# Patient Record
Sex: Female | Born: 1965 | Race: Black or African American | Hispanic: No | Smoking: Former smoker
Health system: Southern US, Community
[De-identification: ages and names within clinical notes are randomized; demographics above are authoritative.]

## PROBLEM LIST (undated history)

## (undated) DIAGNOSIS — F1910 Other psychoactive substance abuse, uncomplicated: Secondary | ICD-10-CM

## (undated) DIAGNOSIS — I10 Essential (primary) hypertension: Secondary | ICD-10-CM

## (undated) DIAGNOSIS — Z87898 Personal history of other specified conditions: Secondary | ICD-10-CM

## (undated) DIAGNOSIS — E119 Type 2 diabetes mellitus without complications: Secondary | ICD-10-CM

## (undated) DIAGNOSIS — G709 Myoneural disorder, unspecified: Secondary | ICD-10-CM

## (undated) DIAGNOSIS — N94810 Vulvar vestibulitis: Secondary | ICD-10-CM

## (undated) HISTORY — PX: INDUCED ABORTION: SHX677

## (undated) HISTORY — DX: Type 2 diabetes mellitus without complications: E11.9

## (undated) HISTORY — DX: Personal history of other specified conditions: Z87.898

## (undated) HISTORY — DX: Other psychoactive substance abuse, uncomplicated: F19.10

## (undated) HISTORY — PX: BUNIONECTOMY: SHX129

## (undated) HISTORY — DX: Vulvar vestibulitis: N94.810

---

## 1998-02-20 ENCOUNTER — Inpatient Hospital Stay (HOSPITAL_COMMUNITY): Admission: AD | Admit: 1998-02-20 | Discharge: 1998-02-23 | Payer: Self-pay | Admitting: Obstetrics

## 1998-09-24 HISTORY — PX: TUBAL LIGATION: SHX77

## 1999-12-28 ENCOUNTER — Other Ambulatory Visit: Admission: RE | Admit: 1999-12-28 | Discharge: 1999-12-28 | Payer: Self-pay | Admitting: Obstetrics

## 2000-01-04 ENCOUNTER — Ambulatory Visit (HOSPITAL_COMMUNITY): Admission: RE | Admit: 2000-01-04 | Discharge: 2000-01-04 | Payer: Self-pay | Admitting: Obstetrics

## 2001-08-14 DIAGNOSIS — Z87898 Personal history of other specified conditions: Secondary | ICD-10-CM

## 2001-08-14 HISTORY — DX: Personal history of other specified conditions: Z87.898

## 2003-03-19 ENCOUNTER — Other Ambulatory Visit: Admission: RE | Admit: 2003-03-19 | Discharge: 2003-03-19 | Payer: Self-pay | Admitting: Obstetrics and Gynecology

## 2003-09-25 DIAGNOSIS — N94810 Vulvar vestibulitis: Secondary | ICD-10-CM

## 2003-09-25 HISTORY — DX: Vulvar vestibulitis: N94.810

## 2004-07-28 ENCOUNTER — Other Ambulatory Visit: Admission: RE | Admit: 2004-07-28 | Discharge: 2004-07-28 | Payer: Self-pay | Admitting: Obstetrics and Gynecology

## 2004-11-09 ENCOUNTER — Emergency Department (HOSPITAL_COMMUNITY): Admission: EM | Admit: 2004-11-09 | Discharge: 2004-11-09 | Payer: Self-pay | Admitting: Emergency Medicine

## 2006-12-04 ENCOUNTER — Emergency Department (HOSPITAL_COMMUNITY): Admission: EM | Admit: 2006-12-04 | Discharge: 2006-12-04 | Payer: Self-pay | Admitting: Family Medicine

## 2007-01-07 ENCOUNTER — Emergency Department (HOSPITAL_COMMUNITY): Admission: EM | Admit: 2007-01-07 | Discharge: 2007-01-07 | Payer: Self-pay | Admitting: Emergency Medicine

## 2007-08-19 ENCOUNTER — Other Ambulatory Visit: Admission: RE | Admit: 2007-08-19 | Discharge: 2007-08-19 | Payer: Self-pay | Admitting: Obstetrics and Gynecology

## 2008-05-03 ENCOUNTER — Emergency Department (HOSPITAL_COMMUNITY): Admission: EM | Admit: 2008-05-03 | Discharge: 2008-05-03 | Payer: Self-pay | Admitting: Emergency Medicine

## 2008-05-05 ENCOUNTER — Ambulatory Visit: Payer: Self-pay | Admitting: *Deleted

## 2008-07-03 ENCOUNTER — Emergency Department (HOSPITAL_COMMUNITY): Admission: EM | Admit: 2008-07-03 | Discharge: 2008-07-03 | Payer: Self-pay | Admitting: Emergency Medicine

## 2008-10-19 ENCOUNTER — Other Ambulatory Visit: Admission: RE | Admit: 2008-10-19 | Discharge: 2008-10-19 | Payer: Self-pay | Admitting: Obstetrics and Gynecology

## 2009-10-31 ENCOUNTER — Encounter: Admission: RE | Admit: 2009-10-31 | Discharge: 2009-10-31 | Payer: Self-pay | Admitting: Obstetrics and Gynecology

## 2009-11-22 HISTORY — PX: CHOLECYSTECTOMY, LAPAROSCOPIC: SHX56

## 2009-12-22 ENCOUNTER — Encounter: Admission: RE | Admit: 2009-12-22 | Discharge: 2009-12-22 | Payer: Self-pay | Admitting: Surgery

## 2009-12-28 ENCOUNTER — Encounter (INDEPENDENT_AMBULATORY_CARE_PROVIDER_SITE_OTHER): Payer: Self-pay

## 2009-12-28 ENCOUNTER — Inpatient Hospital Stay (HOSPITAL_COMMUNITY): Admission: EM | Admit: 2009-12-28 | Discharge: 2009-12-29 | Payer: Self-pay | Admitting: Emergency Medicine

## 2010-01-05 ENCOUNTER — Encounter: Admission: RE | Admit: 2010-01-05 | Discharge: 2010-01-05 | Payer: Self-pay | Admitting: Surgery

## 2010-10-14 ENCOUNTER — Other Ambulatory Visit: Payer: Self-pay | Admitting: Family Medicine

## 2010-10-14 DIAGNOSIS — Z1231 Encounter for screening mammogram for malignant neoplasm of breast: Secondary | ICD-10-CM

## 2010-10-14 DIAGNOSIS — Z1239 Encounter for other screening for malignant neoplasm of breast: Secondary | ICD-10-CM

## 2010-10-15 ENCOUNTER — Encounter: Payer: Self-pay | Admitting: Surgery

## 2010-11-01 ENCOUNTER — Ambulatory Visit
Admission: RE | Admit: 2010-11-01 | Discharge: 2010-11-01 | Disposition: A | Discharge status: Home / Self Care | Payer: 59 | Source: Ambulatory Visit | Attending: Family Medicine | Admitting: Family Medicine

## 2010-11-01 DIAGNOSIS — Z1231 Encounter for screening mammogram for malignant neoplasm of breast: Secondary | ICD-10-CM

## 2010-12-13 LAB — CBC
MCV: 93.9 fL (ref 78.0–100.0)
Platelets: 301 10*3/uL (ref 150–400)
RBC: 4.35 MIL/uL (ref 3.87–5.11)
RDW: 13.3 % (ref 11.5–15.5)
WBC: 9.7 10*3/uL (ref 4.0–10.5)

## 2010-12-13 LAB — COMPREHENSIVE METABOLIC PANEL
AST: 16 U/L (ref 0–37)
CO2: 30 mEq/L (ref 19–32)
Chloride: 102 mEq/L (ref 96–112)
Creatinine, Ser: 0.71 mg/dL (ref 0.4–1.2)
Glucose, Bld: 105 mg/dL — ABNORMAL HIGH (ref 70–99)
Potassium: 3.3 mEq/L — ABNORMAL LOW (ref 3.5–5.1)
Total Protein: 7.4 g/dL (ref 6.0–8.3)

## 2010-12-13 LAB — URINALYSIS, ROUTINE W REFLEX MICROSCOPIC
Ketones, ur: NEGATIVE mg/dL
Protein, ur: NEGATIVE mg/dL
Urobilinogen, UA: 1 mg/dL (ref 0.0–1.0)

## 2010-12-13 LAB — DIFFERENTIAL
Basophils Absolute: 0.1 10*3/uL (ref 0.0–0.1)
Basophils Relative: 1 % (ref 0–1)
Monocytes Relative: 5 % (ref 3–12)

## 2011-02-09 NOTE — Op Note (Signed)
[facility]  Patient:    Kayla Rojas, Kayla Rojas            MRN: [id number] Proc. Date: 01/04/00 Adm. Date:  [id number] Attending:  Venita Sheffield                           Operative Report  PREOPERATIVE DIAGNOSIS:       Multiparity.  POSTOPERATIVE DIAGNOSIS:  PROCEDURE:                    Open laparoscopic tubal sterilization.  SURGEON:                      Kathreen Cosier, M.D.  ANESTHESIA:                   General anesthesia.  DESCRIPTION OF PROCEDURE:     Under general anesthesia, patient in lithotomy position, abdomen, perineum and vagina were prepped and draped; bladder emptied  with a straight catheter.  Weighted speculum was placed in the vagina; anterior lip of the cervix grasped with a Hulka tenaculum.  In the umbilicus, a transverse incision was made and carried down to the fascia.  Fascia was cleaned and grasped with two Kochers.  Fascia and the peritoneum were opened with the Mayo scissors; the sleeve of the trocar inserted intraperitoneally and 3 L of CO2 infused intraperitoneally.  Visualizing scope inserted through the sleeve of the trocar. Uterus, tubes and ovaries were normal.  The cautery probe was inserted through he sleeve of the scope.  The right tube was grasped with the cautery probe and traced to the fimbria.  Starting one inch from the cornu, the tube was cauterized in a  total of five places, moving laterally from the first site of cautery; approximately two inches of tube were cauterized.  The procedure was done in a similar fashion on the other side.  The probe was removed and CO2 allowed to escape from the peritoneal cavity.  Fascia was closed with one suture of 0 Dexon and the skin closed with a subcuticular suture of 3-0 plain.  Patient tolerated the procedure well and taken to recovery room in good condition. DD:  01/04/00 TD:  01/04/00 Job: [id number]

## 2011-06-25 LAB — POCT URINALYSIS DIP (DEVICE)
Glucose, UA: NEGATIVE
Hgb urine dipstick: NEGATIVE
Operator id: 282151
Protein, ur: 100 — AB
Specific Gravity, Urine: 1.02
pH: 7

## 2011-06-25 LAB — GC/CHLAMYDIA PROBE AMP, GENITAL: Chlamydia, DNA Probe: NEGATIVE

## 2011-06-25 LAB — WET PREP, GENITAL
Trich, Wet Prep: NONE SEEN
WBC, Wet Prep HPF POC: NONE SEEN

## 2011-11-20 ENCOUNTER — Other Ambulatory Visit: Payer: Self-pay | Admitting: Family Medicine

## 2011-11-20 DIAGNOSIS — Z1231 Encounter for screening mammogram for malignant neoplasm of breast: Secondary | ICD-10-CM

## 2011-12-04 ENCOUNTER — Other Ambulatory Visit: Payer: Self-pay | Admitting: Gastroenterology

## 2011-12-04 ENCOUNTER — Ambulatory Visit
Admission: RE | Admit: 2011-12-04 | Discharge: 2011-12-04 | Disposition: A | Discharge status: Home / Self Care | Payer: 59 | Source: Ambulatory Visit | Attending: Family Medicine | Admitting: Family Medicine

## 2011-12-04 DIAGNOSIS — R1032 Left lower quadrant pain: Secondary | ICD-10-CM

## 2011-12-04 DIAGNOSIS — Z1231 Encounter for screening mammogram for malignant neoplasm of breast: Secondary | ICD-10-CM

## 2011-12-05 ENCOUNTER — Ambulatory Visit: Payer: 59

## 2011-12-21 ENCOUNTER — Ambulatory Visit
Admission: RE | Admit: 2011-12-21 | Discharge: 2011-12-21 | Disposition: A | Discharge status: Home / Self Care | Payer: 59 | Source: Ambulatory Visit | Attending: Gastroenterology | Admitting: Gastroenterology

## 2011-12-21 DIAGNOSIS — R1032 Left lower quadrant pain: Secondary | ICD-10-CM

## 2011-12-21 MED ORDER — IOHEXOL 300 MG/ML  SOLN
100.0000 mL | Freq: Once | INTRAMUSCULAR | Status: AC | PRN
  Administered 2011-12-21: 100 mL via INTRAVENOUS

## 2012-01-02 ENCOUNTER — Other Ambulatory Visit: Payer: Self-pay | Admitting: Gastroenterology

## 2012-01-02 DIAGNOSIS — R1032 Left lower quadrant pain: Secondary | ICD-10-CM

## 2012-01-02 DIAGNOSIS — M71052 Abscess of bursa, left hip: Secondary | ICD-10-CM

## 2012-01-02 DIAGNOSIS — R933 Abnormal findings on diagnostic imaging of other parts of digestive tract: Secondary | ICD-10-CM

## 2012-01-03 ENCOUNTER — Other Ambulatory Visit: Payer: Self-pay | Admitting: Gastroenterology

## 2012-01-03 DIAGNOSIS — R933 Abnormal findings on diagnostic imaging of other parts of digestive tract: Secondary | ICD-10-CM

## 2012-01-03 DIAGNOSIS — R1032 Left lower quadrant pain: Secondary | ICD-10-CM

## 2012-01-04 ENCOUNTER — Other Ambulatory Visit: Payer: Self-pay | Admitting: Gastroenterology

## 2012-01-04 ENCOUNTER — Other Ambulatory Visit: Payer: Self-pay | Admitting: Radiology

## 2012-01-10 ENCOUNTER — Other Ambulatory Visit: Payer: Self-pay | Admitting: Gastroenterology

## 2012-01-10 ENCOUNTER — Ambulatory Visit (HOSPITAL_COMMUNITY)
Admission: RE | Admit: 2012-01-10 | Discharge: 2012-01-10 | Disposition: A | Discharge status: Home / Self Care | Payer: 59 | Source: Ambulatory Visit | Attending: Gastroenterology | Admitting: Gastroenterology

## 2012-01-10 DIAGNOSIS — R1904 Left lower quadrant abdominal swelling, mass and lump: Secondary | ICD-10-CM | POA: Insufficient documentation

## 2012-01-10 DIAGNOSIS — R1032 Left lower quadrant pain: Secondary | ICD-10-CM

## 2012-01-10 DIAGNOSIS — I10 Essential (primary) hypertension: Secondary | ICD-10-CM | POA: Insufficient documentation

## 2012-01-10 DIAGNOSIS — R933 Abnormal findings on diagnostic imaging of other parts of digestive tract: Secondary | ICD-10-CM

## 2012-01-10 DIAGNOSIS — E119 Type 2 diabetes mellitus without complications: Secondary | ICD-10-CM | POA: Insufficient documentation

## 2012-01-10 DIAGNOSIS — F17200 Nicotine dependence, unspecified, uncomplicated: Secondary | ICD-10-CM | POA: Insufficient documentation

## 2012-01-10 LAB — APTT: aPTT: 28 s (ref 24–37)

## 2012-01-10 LAB — CBC
HCT: 42.5 % (ref 36.0–46.0)
Hemoglobin: 14.7 g/dL (ref 12.0–15.0)
RDW: 13.3 % (ref 11.5–15.5)
WBC: 9.1 10*3/uL (ref 4.0–10.5)

## 2012-01-10 LAB — PROTIME-INR
INR: 1.05 (ref 0.00–1.49)
Prothrombin Time: 13.9 s (ref 11.6–15.2)

## 2012-01-10 MED ORDER — SODIUM CHLORIDE 0.9 % IV SOLN
INTRAVENOUS | Status: DC | PRN
  Administered 2012-01-10: 50 mL/h via INTRAVENOUS

## 2012-01-10 MED ORDER — FENTANYL CITRATE 0.05 MG/ML IJ SOLN
INTRAMUSCULAR | Status: DC | PRN
  Administered 2012-01-10: 25 ug via INTRAVENOUS
  Administered 2012-01-10: 50 ug via INTRAVENOUS

## 2012-01-10 MED ORDER — MIDAZOLAM HCL 5 MG/5ML IJ SOLN
INTRAMUSCULAR | Status: DC | PRN
  Administered 2012-01-10: 1 mg via INTRAVENOUS
  Administered 2012-01-10: 0.5 mg via INTRAVENOUS

## 2012-01-10 MED ORDER — MIDAZOLAM HCL 2 MG/2ML IJ SOLN
INTRAMUSCULAR | Status: AC
  Filled 2012-01-10: qty 4

## 2012-01-10 MED ORDER — FENTANYL CITRATE 0.05 MG/ML IJ SOLN
INTRAMUSCULAR | Status: AC
  Filled 2012-01-10: qty 4

## 2012-01-10 MED ORDER — SODIUM CHLORIDE 0.9 % IV SOLN: Freq: Once | INTRAVENOUS | Status: DC

## 2012-01-10 NOTE — H&P (Signed)
Kayla Rojas is an 46 y.o. female.   Chief Complaint: left lower quadrant pain HPI: Patient with history of LLQ pain intermittently since 2008 and recent CT revealing a complex fluid collection anterior to the left iliopsoas muscle, suspicious for bursitis. She presents today for US guided aspiration of the fluid collection.  PMH: HTN,DM, diverticulosis; denies CAD,COPD,CVA, CA PSH: cholecystectomy, bunion surgery, BTL Social History: positive smoker, positive alcohol use; married, 2 children, works at [employer] History: positive for cancer, DM, cirrhosis, MS, HTN; has 1 sister Allergies: No Known Allergies   Current outpatient prescriptions:acetaminophen (TYLENOL) 500 MG tablet, Take 500 mg by mouth every 6 (six) hours as needed. For pain, Disp: , Rfl: ;  atenolol (TENORMIN) 50 MG tablet, Take 50 mg by mouth daily., Disp: , Rfl: ;  hydrochlorothiazide (HYDRODIURIL) 25 MG tablet, Take 12.5 mg by mouth daily., Disp: , Rfl: ;  losartan (COZAAR) 50 MG tablet, Take 50 mg by mouth daily., Disp: , Rfl:  Current facility-administered medications:0.9 %  sodium chloride infusion, , Intravenous, Once, Robet Leu, PA  No results found for this or any previous visit (from the past 48 hour(s)). No results found.  Review of Systems  Constitutional: Negative for fever and chills.  Respiratory: Negative for cough and shortness of breath.   Cardiovascular: Negative for chest pain.  Gastrointestinal: Positive for abdominal pain. Negative for nausea and vomiting.       LLQ discomfort  Genitourinary: Negative for dysuria.  Musculoskeletal: Positive for back pain.  Neurological: Positive for headaches.  Endo/Heme/Allergies: Does not bruise/bleed easily.   Results for orders placed during the hospital encounter of 01/10/12  APTT      Component Value Range   aPTT 28  24 - 37 (seconds)  CBC      Component Value Range   WBC 9.1  4.0 - 10.5 (K/uL)   RBC 4.67  3.87 - 5.11 (MIL/uL)     Hemoglobin 14.7  12.0 - 15.0 (g/dL)   HCT 45.4  09.8 - 11.9 (%)   MCV 91.0  78.0 - 100.0 (fL)   MCH 31.5  26.0 - 34.0 (pg)   MCHC 34.6  30.0 - 36.0 (g/dL)   RDW 14.7  82.9 - 56.2 (%)   Platelets 319  150 - 400 (K/uL)  PROTIME-INR      Component Value Range   Prothrombin Time 13.9  11.6 - 15.2 (seconds)   INR 1.05  0.00 - 1.49     Blood pressure 127/87, pulse 91, temperature 97.5 F (36.4 C), temperature source Oral, resp. rate 18, height 5\' 4"  (1.626 m), weight 170 lb (77.111 kg), last menstrual period 12/21/2011, SpO2 99.00%. Physical Exam  Constitutional: She is oriented to person, place, and time. She appears well-developed and well-nourished.  Cardiovascular: Normal rate and regular rhythm.   Respiratory: Effort normal and breath sounds normal.  GI: Soft. Bowel sounds are normal.       Mild-mod tender LLQ region at area of iliopsoas bursa  Musculoskeletal: Normal range of motion. She exhibits no edema.  Neurological: She is alert and oriented to person, place, and time.     Assessment/Plan Patient with symptomatic complex fluid collection anterior to left iliopsoas muscle; plan is for US guided aspiration of fluid collection. Details/risks of above d/w pt with her understanding and consent.  Jemery Stacey,D KEVIN 01/10/2012, 1:23 PM

## 2012-01-10 NOTE — Procedures (Signed)
Technically successful US guided biopsy of soft tissue mass adjacent to left iliopsoas muscle.  No immediate complications.

## 2012-01-10 NOTE — Discharge Instructions (Signed)
Moderate Sedation, Adult Moderate sedation is given to help you relax or even sleep through a procedure. You may remain sleepy, be clumsy, or have poor balance for several hours following this procedure. Arrange for a responsible adult, family member, or friend to take you home. A responsible adult should stay with you for at least 24 hours or until the medicines have worn off.  Do not participate in any activities where you could become injured for the next 24 hours, or until you feel normal again. Do not:   Drive.   Swim.   Ride a bicycle.   Operate heavy machinery.   Cook.   Use power tools.   Climb ladders.   Work at [employer].   Do not make important decisions or sign legal documents until you are improved.   Vomiting may occur if you eat too soon. When you can drink without vomiting, try water, juice, or soup. Try solid foods if you feel little or no nausea.   Only take over-the-counter or prescription medications for pain, discomfort, or fever as directed by your caregiver.If pain medications have been prescribed for you, ask your caregiver how soon it is safe to take them.   Make sure you and your family fully understands everything about the medication given to you. Make sure you understand what side effects may occur.   You should not drink alcohol, take sleeping pills, or medications that cause drowsiness for at least 24 hours.   If you smoke, do not smoke alone.   If you are feeling better, you may resume normal activities 24 hours after receiving sedation.   Keep all appointments as scheduled. Follow all instructions.   Ask questions if you do not understand.  SEEK MEDICAL CARE IF:   Your skin is pale or bluish in color.   You continue to feel sick to your stomach (nauseous) or throw up (vomit).   Your pain is getting worse and not helped by medication.   You have bleeding or swelling.   You are still sleepy or feeling clumsy after 24 hours.  SEEK IMMEDIATE  MEDICAL CARE IF:   You develop a rash.   You have difficulty breathing.   You develop any type of allergic problem.   You have a fever.  Document Released: 06/05/2001 Document Revised: 08/30/2011 Document Reviewed: 10/27/2007 [facility] Patient Information 2012 [place], [place].Muscle Biopsy Your caregiver has recommended that you have a muscle biopsy (tissue sample) to confirm or prove a suspected muscular problem. During the biopsy, a small piece of muscle tissue is removed. It is then examined under a microscope by a pathologist (a specialist in tissue examination). Chemical tests can be run if they are indicated. Biopsies are taken when your caregiver cannot be 100% certain of the diagnosis (learning what is wrong) by physical exam, X-rays, or other studies. LET YOUR CAREGIVER KNOW ABOUT:  Allergies.   Medications taken including herbs, eye drops, over the counter medications, and creams.   Use of steroids (by mouth or creams)   Previous problems with anesthetics or novocaine.   Possibility of pregnancy, if this applies.   History of blood clots (thrombophlebitis).   History of bleeding or blood problems.   Previous surgery.   Other health problems.  BEFORE THE PROCEDURE  You should be present 60 minutes prior to your procedure or as directed.  PROCEDURE  A biopsy is often performed as a same day surgery. This can be done in a hospital or clinic. Biopsies are often  performed under local anesthesia. This is accomplished by injecting medicine that makes the area of biopsy numb. General anesthesia is usually required for very young children, this means they would be sleeping through the procedure. Muscle biopsies are generally performed by making an incision and removing a small piece of muscle.  AFTER THE PROCEDURE  You will be taken to the recovery area where a nurse will watch and check your progress. Once you are awake, stable, and taking fluids well, barring other problems you  will be allowed to go home. If the procedure has been minor you may be allowed to go home immediately. Once home, an ice pack applied to your operative site may help with discomfort and keep swelling down.  You may resume normal diet and activities as directed.   If the muscle biopsy was performed on an arm or leg, avoid vigorous activity until your surgeon says it is okay.   Change dressings as directed.   Only take over-the-counter or prescription medicines for pain, discomfort, or fever as directed by your caregiver.   Call for your results as instructed by your caregiver. Remember it is your responsibility to obtain the results of your biopsy and any other tests performed. Do not assume everything is fine if you do not hear from your caregiver.  SEEK MEDICAL CARE IF:   You have increased bleeding (more than a small spot) from biopsy sites.   You develop redness, swelling, or increasing pain in the biopsy sites.   There is pus coming from the wound.   You have an unexplained oral temperature over 102 F (38.9 C).   A foul smell is coming from the wound or dressing.  SEEK IMMEDIATE MEDICAL CARE IF:   You develop a rash.   You have difficulty breathing.   You have any allergic problems.  Document Released: 12/17/2000 Document Revised: 08/30/2011 Document Reviewed: 12/31/2008 [facility] Patient Information 2012 [place], [place].

## 2012-01-10 NOTE — ED Notes (Addendum)
IV  Left hand discontinued. Catheter intact.

## 2012-01-11 LAB — GLUCOSE, CAPILLARY: Glucose-Capillary: 110 mg/dL — ABNORMAL HIGH (ref 70–99)

## 2012-06-26 ENCOUNTER — Encounter (HOSPITAL_COMMUNITY): Payer: Self-pay

## 2012-06-26 ENCOUNTER — Inpatient Hospital Stay (HOSPITAL_COMMUNITY)
Admission: AD | Admit: 2012-06-26 | Discharge: 2012-06-26 | Disposition: A | Discharge status: Home / Self Care | Payer: 59 | Source: Ambulatory Visit | Attending: Gynecology | Admitting: Gynecology

## 2012-06-26 DIAGNOSIS — R1032 Left lower quadrant pain: Secondary | ICD-10-CM

## 2012-06-26 HISTORY — DX: Essential (primary) hypertension: I10

## 2012-06-26 LAB — URINALYSIS, ROUTINE W REFLEX MICROSCOPIC
Bilirubin Urine: NEGATIVE
Hgb urine dipstick: NEGATIVE
Ketones, ur: NEGATIVE mg/dL
Nitrite: NEGATIVE
Protein, ur: NEGATIVE mg/dL
Urobilinogen, UA: 0.2 mg/dL (ref 0.0–1.0)

## 2012-06-26 LAB — WET PREP, GENITAL
Clue Cells Wet Prep HPF POC: NONE SEEN
Trich, Wet Prep: NONE SEEN
Yeast Wet Prep HPF POC: NONE SEEN

## 2012-06-26 LAB — CBC
MCH: 30.8 pg (ref 26.0–34.0)
MCHC: 33.6 g/dL (ref 30.0–36.0)
MCV: 91.6 fL (ref 78.0–100.0)
Platelets: 262 10*3/uL (ref 150–400)
RBC: 4.16 MIL/uL (ref 3.87–5.11)

## 2012-06-26 NOTE — MAU Note (Signed)
Patient states she has been having left abdominal pain that radiates to left flank off and on for a while. States it woke her up last night and is getting worse. Abdominal bloating.

## 2012-06-26 NOTE — MAU Provider Note (Signed)
History     CSN: [id number]  Arrival date and time: 06/26/12 4098   None     Chief Complaint  Patient presents with  . Abdominal Pain   HPI 46 y.o. [id number] with LLQ pain x "years", gets worse with menstrual cycle, has been evaluated multiple times by GI, GYN. Has had u/s and other imaging multiple times. Nothing GYN related has ever been found, does state that she has been diagnosed with diverticulitis. Last night had episode of bloating and cramping, relieved with aleve. Not having pain now. Patient's last menstrual period was 05/26/2012. Expecting her period at any time. Has heavy periods, duration about 7 days, have been getting heavier and longer. Also states, "I think I have BV". Sees Malachi Pro, NP at Dr. Harlene Salts office.    Past Medical History  Diagnosis Date  . Hypertension   . Diabetes mellitus     Past Surgical History  Procedure Date  . Induced abortion   . Cholestectomy   . Bunionectomy   . Cesarean section     Family History  Problem Relation Age of Onset  . Other Other     History  Substance Use Topics  . Smoking status: Former Games developer  . Smokeless tobacco: Not on file  . Alcohol Use: Yes    Allergies: No Known Allergies  No prescriptions prior to admission    Review of Systems  Constitutional: Negative.   Respiratory: Negative.   Cardiovascular: Negative.   Gastrointestinal: Positive for abdominal pain. Negative for nausea, vomiting, diarrhea and constipation.  Genitourinary: Negative for dysuria, urgency, frequency, hematuria and flank pain.       Negative for vaginal bleeding, + vaginal discharge   Musculoskeletal: Negative.   Neurological: Negative.   Psychiatric/Behavioral: Negative.    Physical Exam   Blood pressure 144/98, pulse 71, temperature 98.3 F (36.8 C), temperature source Oral, resp. rate 18, height 5' 4.75" (1.645 m), weight 177 lb 9.6 oz (80.559 kg), last menstrual period 05/26/2012, SpO2 100.00%.  Physical Exam    Nursing note and vitals reviewed. Constitutional: She is oriented to person, place, and time. She appears well-developed and well-nourished. No distress.  HENT:  Head: Normocephalic and atraumatic.  Cardiovascular: Normal rate and regular rhythm.   Respiratory: Effort normal. No respiratory distress.  GI: Soft. She exhibits no distension and no mass. There is no tenderness. There is no rebound and no guarding.  Genitourinary: There is no rash or lesion on the right labia. There is no rash or lesion on the left labia. Uterus is not deviated, not enlarged, not fixed and not tender. Cervix exhibits no motion tenderness, no discharge and no friability. Right adnexum displays no mass, no tenderness and no fullness. Left adnexum displays no mass, no tenderness and no fullness. No erythema, tenderness or bleeding around the vagina. Vaginal discharge (white, non malodorous) found.  Neurological: She is alert and oriented to person, place, and time.  Skin: Skin is warm and dry.  Psychiatric: She has a normal mood and affect.    MAU Course  Procedures  Results for orders placed during the hospital encounter of 06/26/12 (from the past 24 hour(s))  URINALYSIS, ROUTINE W REFLEX MICROSCOPIC     Status: Normal   Collection Time   06/26/12  9:18 AM      Component Value Range   Color, Urine YELLOW  YELLOW   APPearance CLEAR  CLEAR   Specific Gravity, Urine 1.020  1.005 - 1.030   pH 7.0  5.0 -  8.0   Glucose, UA NEGATIVE  NEGATIVE mg/dL   Hgb urine dipstick NEGATIVE  NEGATIVE   Bilirubin Urine NEGATIVE  NEGATIVE   Ketones, ur NEGATIVE  NEGATIVE mg/dL   Protein, ur NEGATIVE  NEGATIVE mg/dL   Urobilinogen, UA 0.2  0.0 - 1.0 mg/dL   Nitrite NEGATIVE  NEGATIVE   Leukocytes, UA NEGATIVE  NEGATIVE  WET PREP, GENITAL     Status: Abnormal   Collection Time   06/26/12  9:54 AM      Component Value Range   Yeast Wet Prep HPF POC NONE SEEN  NONE SEEN   Trich, Wet Prep NONE SEEN  NONE SEEN   Clue Cells Wet  Prep HPF POC NONE SEEN  NONE SEEN   WBC, Wet Prep HPF POC MODERATE (*) NONE SEEN  CBC     Status: Normal   Collection Time   06/26/12 10:07 AM      Component Value Range   WBC 8.3  4.0 - 10.5 K/uL   RBC 4.16  3.87 - 5.11 MIL/uL   Hemoglobin 12.8  12.0 - 15.0 g/dL   HCT 44.0  10.2 - 72.5 %   MCV 91.6  78.0 - 100.0 fL   MCH 30.8  26.0 - 34.0 pg   MCHC 33.6  30.0 - 36.0 g/dL   RDW 36.6  44.0 - 34.7 %   Platelets 262  150 - 400 K/uL     Assessment and Plan   1. LLQ pain    Follow-up Information    Follow up with Bennye Alm, MD. (As needed)    Contact information:   [facility] [address] [place] [address] [phone]         Also recommended f/u with GI doctor or PCP    [facility] 06/26/2012, 2:56 PM

## 2012-06-27 LAB — GC/CHLAMYDIA PROBE AMP, GENITAL
Chlamydia, DNA Probe: NEGATIVE
GC Probe Amp, Genital: NEGATIVE

## 2012-10-28 ENCOUNTER — Other Ambulatory Visit: Payer: Self-pay | Admitting: Family Medicine

## 2012-10-28 DIAGNOSIS — Z1231 Encounter for screening mammogram for malignant neoplasm of breast: Secondary | ICD-10-CM

## 2012-12-04 ENCOUNTER — Ambulatory Visit
Admission: RE | Admit: 2012-12-04 | Discharge: 2012-12-04 | Disposition: A | Discharge status: Home / Self Care | Payer: 59 | Source: Ambulatory Visit | Attending: Family Medicine | Admitting: Family Medicine

## 2012-12-04 DIAGNOSIS — Z1231 Encounter for screening mammogram for malignant neoplasm of breast: Secondary | ICD-10-CM

## 2012-12-22 ENCOUNTER — Telehealth: Payer: Self-pay | Admitting: Obstetrics & Gynecology

## 2012-12-22 NOTE — Telephone Encounter (Signed)
APPT R/S TO 12-29-12/SY

## 2012-12-22 NOTE — Telephone Encounter (Signed)
physical/ consult with Dr. Hyacinth Meeker sue/pt wants to rs to another day than 12/23/12 (cx'd)/[place]

## 2012-12-23 ENCOUNTER — Ambulatory Visit: Payer: Self-pay | Admitting: Obstetrics & Gynecology

## 2012-12-31 ENCOUNTER — Encounter: Payer: Self-pay | Admitting: Obstetrics & Gynecology

## 2012-12-31 ENCOUNTER — Ambulatory Visit (INDEPENDENT_AMBULATORY_CARE_PROVIDER_SITE_OTHER): Payer: 59 | Admitting: Obstetrics & Gynecology

## 2012-12-31 VITALS — BP 132/84 | HR 64 | Resp 25

## 2012-12-31 DIAGNOSIS — R1032 Left lower quadrant pain: Secondary | ICD-10-CM

## 2012-12-31 NOTE — Progress Notes (Signed)
47 y.o. Married AA femal G2P2 here for complaint of pelvic pain.  Pain started for patient back as far as 2007.  Pain is located in LLQ and always seems to occur either a few days before or at the beginning of her cycle.  Typically cycles are 5 days with two days being heavy.  Pain is not usually present during the heaviest days of her cycles.  Pain is achy but can be sharp and usually lasts two days.  She usually takes Aleve--two in the am when she has the pain and this really helps.  She has been advised by her PCP to stop the Aleve so she's also tried Tylenol but this seems to do nothing for her.  Back in 2008, she did have a pelvic ultrasound where uterus was 10.7 x 4.8 x 6.1cm and ovaries appeared normal except for probable 16mm involuting right cyst was noted.  At same time liver, both kidneys and gall bladder was seen.  Gall bladder was fully of stones at that time.  GI referral and general surgery consultations recommended.  Patient did not proceed with either.  The pain continued over the next several years.  RUQ pain then began to become a bigger issue so she did see Dr. Marcille Blanco in 4/11.  Cholecystectomy was recommended.  This was performed later that year.  RUQ pain resolved but the LLQ pain continued.   In 2013, she did see Dr. Charna Elizabeth, GI, who recommended fiber and probiotics.  CT of abdomen and pelvis was performed 3/13.  Only significant finding was a 2 x 3cm fluid collection noted in left ileopsoas muscle.  An attempt was made to aspirate this which was extremely uncomfortable according to the patient.  Patient felt like this did nothing to help the pain.  In 11/13, she had one episode that was significant enough she went to the ER.  Records reviewed.  No significant testing was performed.  She was D/C'ed home to f/u in office.  She did not but then discussed this issue again with nurse practitioner, Shirlyn Goltz.  Patient has undergone 2 c-sections in the pat as well as a laparoscopic BTL  in 2000 and the lap chole in 2011.  Patient does not feel this pain has any urinary associations but primarily feels like it is related to her cycle.  She is here to see if I have any other recommendations.  We discussed the differential diagnosis of pelvic pain.  Because of the relation to menstrual cycle, this could be endometriosis.  We discussed difficulty in diagnosis by ultrasound or other imaging techniques.  Discussed with patient laparoscopy with tissue biopsy for definitive diagnosis.  Procedure, risks, benefits, recovery, time out of work all discussed.  She definitely wants to proceed--if for no other reason than to know for sure it is NOT endometriosis.  She understands that endometriosis can be treated at the same time of laparoscopy but that I would not remove any tissue--ovaries, for example, unless she gave me prior consent.  Specifically, risks of bleeding, infection, rare risk of bowel/bladder/ureteral injury all discussed.  She knows there could be no significant findings.  She is willing to take these risks and proceed.  She does not request pain medications and would prefer to only use Aleve but is unsure why Dr. Paulino Rily told her not to take it anymore but prescribed Mobic.    Exam:   BP 132/84  Pulse 64  Resp 25  LMP 12/22/2012  General appearance:  WNWD AAF, NAD No other physical exam performed today  A: Chronic LLQ pelvic pain  P: Diagnostic laparoscopy to R/O endometriosis.  Will plan at her convenience. Plan pre-op PUS as well.  30 minutes spent with patient >50% of time was in face to face discussion of above.        An After Visit Summary was printed and given to the patient.

## 2012-12-31 NOTE — Patient Instructions (Signed)
[name] will call to schedule your procedure as we discussed in the office.

## 2013-01-22 ENCOUNTER — Encounter (HOSPITAL_COMMUNITY): Payer: Self-pay | Admitting: Pharmacy Technician

## 2013-01-23 ENCOUNTER — Encounter (HOSPITAL_COMMUNITY): Payer: Self-pay

## 2013-01-23 ENCOUNTER — Encounter (HOSPITAL_COMMUNITY)
Admission: RE | Admit: 2013-01-23 | Discharge: 2013-01-23 | Disposition: A | Discharge status: Home / Self Care | Payer: 59 | Source: Ambulatory Visit | Attending: Obstetrics & Gynecology | Admitting: Obstetrics & Gynecology

## 2013-01-23 ENCOUNTER — Telehealth: Payer: Self-pay | Admitting: *Deleted

## 2013-01-23 ENCOUNTER — Other Ambulatory Visit: Payer: Self-pay

## 2013-01-23 DIAGNOSIS — Z01812 Encounter for preprocedural laboratory examination: Secondary | ICD-10-CM | POA: Insufficient documentation

## 2013-01-23 DIAGNOSIS — Z01818 Encounter for other preprocedural examination: Secondary | ICD-10-CM | POA: Insufficient documentation

## 2013-01-23 HISTORY — DX: Myoneural disorder, unspecified: G70.9

## 2013-01-23 LAB — BASIC METABOLIC PANEL
CO2: 29 mEq/L (ref 19–32)
Calcium: 9.7 mg/dL (ref 8.4–10.5)
Creatinine, Ser: 0.72 mg/dL (ref 0.50–1.10)
GFR calc non Af Amer: >90 mL/min (ref >90)
Glucose, Bld: 109 mg/dL — ABNORMAL HIGH (ref 70–99)
Sodium: 137 mEq/L (ref 135–145)

## 2013-01-23 LAB — SURGICAL PCR SCREEN: MRSA, PCR: NEGATIVE

## 2013-01-23 LAB — CBC
Hemoglobin: 13.1 g/dL (ref 12.0–15.0)
MCH: 31.3 pg (ref 26.0–34.0)
MCHC: 34.8 g/dL (ref 30.0–36.0)
MCV: 90 fL (ref 78.0–100.0)
Platelets: 262 10*3/uL (ref 150–400)
RBC: 4.18 MIL/uL (ref 3.87–5.11)

## 2013-01-23 NOTE — Pre-Procedure Instructions (Signed)
Pt had abnormal EKG and needs cardiac clearance per Dr. Rodman Pickle. Pt sent to her PCP, Dr. Paulino Rily for evaluation and referral for cardiac clearance. EkG sent with patient.  Dr. Paulino Rily nurse called back after evaluation to say pt cannot get cardiac work up by 01/27/13 and advises to cancel surgery until it is done. I called [name] at Dr. Rondel Baton office and gave her the message.

## 2013-01-23 NOTE — Patient Instructions (Addendum)
Your procedure is scheduled on:01/27/13  Enter through the Main Entrance at :0830 am Pick up desk phone and dial [phone] and inform us of your arrival.  Please call [phone] if you have any problems the morning of surgery.  Remember: Do not eat or drink after midnight:Monday   Take these meds the morning of surgery with a sip of water:all blood pressure meds  DO NOT wear jewelry, eye make-up, lipstick,body lotion, or dark fingernail polish.   Patients discharged on the day of surgery will not be allowed to drive home.

## 2013-01-23 NOTE — Telephone Encounter (Signed)
Per janet, patient had EKG at preop which showed ischemia and she was sent to PCP for eval. PCPcalled [facility] and needs more time to get patient in with cardiology and recommends we delay surgery.  Message relayed to Dr Hyacinth Meeker. Patient and OR notified of cancellation.  Patient will call me back to reschedule once she is clear for surgery.  Dr Rodman Pickle at hosp review hosp labs per Marylu Lund.

## 2013-01-23 NOTE — Pre-Procedure Instructions (Signed)
Patient's EKG, done today at PAT appt, was abnormal and shown to Dr. Rodman Pickle. She wants pt to have cardiac clearance prior to surgery. Pt was sent to her PCP, Dr. Paulino Rily, for further evaluation. EKG copy sent with pt .

## 2013-01-27 ENCOUNTER — Encounter (HOSPITAL_COMMUNITY): Admission: RE | Payer: Self-pay | Source: Ambulatory Visit

## 2013-01-27 ENCOUNTER — Ambulatory Visit (HOSPITAL_COMMUNITY): Admission: RE | Admit: 2013-01-27 | Payer: 59 | Source: Ambulatory Visit | Admitting: Obstetrics & Gynecology

## 2013-01-27 SURGERY — LAPAROSCOPY, DIAGNOSTIC
Anesthesia: General

## 2013-04-28 ENCOUNTER — Telehealth: Payer: Self-pay | Admitting: *Deleted

## 2013-04-28 NOTE — Telephone Encounter (Signed)
Follow up call to patient.  Surgery from 01-27-13 was canceled due to EKG changes and possible ischemia issues.  No additional information visibly available in EPIC. Call to patient, LMTCB.

## 2013-04-29 NOTE — Telephone Encounter (Signed)
Patient returned my call on 04-28-13. She states that cardiac echo was normal and she has not had to have any further testing.  No heart damage was found.  She is interested in surgery but thinks she has "exhausted insurance for the year".  Per precert for ins previously done, OOP max of $6,000.  Advised only calling to follow up with her and make sure she is ok.  Not trying to talk her into surgery.  She states she has so much pain with menses that Aleve helps but PCP does not want her to take long term.  States Tylenol just not any help at all.  Would really like to have surgery if possible.  Offered to check with Dr Hyacinth Meeker regarding any other options for pain.  Also offered to recheck insurance OOP estimate and contact her with this info.  Chart to precert.

## 2013-05-15 ENCOUNTER — Telehealth: Payer: Self-pay | Admitting: *Deleted

## 2013-05-15 NOTE — Telephone Encounter (Signed)
Call to patient and advised of surgery scheduled for 06-16-13 at 1015 at [facility].  Instructions reviewed and will mail copy to patient.  Pre/post op appointments scheduled.  Patient will have menses week prior to surgery and needs to know what she can take for pain in place of Aleve?

## 2013-05-20 NOTE — Telephone Encounter (Signed)
I will give her the rx for her post op pain medicine at her pre op appt and she can take it before the surgery.

## 2013-05-20 NOTE — Telephone Encounter (Signed)
Just checking to see what I need to advise patient to take for menstrual pain week prior to surgery.

## 2013-05-21 NOTE — Telephone Encounter (Signed)
Call to patient to notify of Dr Rondel Baton response for pain mgmt. LMTCB.

## 2013-05-27 ENCOUNTER — Telehealth: Payer: Self-pay | Admitting: *Deleted

## 2013-05-27 NOTE — Progress Notes (Signed)
Spoke to Dr. Malen Gauze regarding bringing pt in for a pre-op.Pt had pre-op in may 2014, and ekg was abnormal. Pt was cleared for surgery. Per Dr foster pt ok to have lab done at Dr. Rondel Baton office. Spoke to [facility] (at the office) lab work will be done sept 8th at her pre-op visit. Instructed patient to have labs done and all other instructions given to patient...  Dawn Loreal Schuessler. RN

## 2013-05-27 NOTE — Telephone Encounter (Signed)
Pt wanting to get her bloodwork that the hospital is requesting on the 8th when she come in for consult with dr Hyacinth Meeker.

## 2013-05-27 NOTE — Telephone Encounter (Signed)
Left message on Amy VM requesting letter for clearance and copy of echo for hospital for surgery on 06-16-13.

## 2013-05-27 NOTE — Telephone Encounter (Signed)
Per Dawn, anesthesia needs medical clearance and copy of echo for surgery.    Please draw CBC and BMP at preop appt so that patient will not have to come for separate preop appt.

## 2013-05-27 NOTE — Telephone Encounter (Signed)
Patient notified that we received call from hospital requesting lab work.  Also notified that call placed for copy of echo and medical clearance.

## 2013-05-28 NOTE — Consults (Signed)
Discussed with Dr. Hyacinth Meeker Cardiology clearance. Patient had diffuse T wave inversions in her anterior leads on 12 lead EKG on 01/27/2013. Her surgery was cancelled at that time for further cardiac w/u. She saw cardiology and echocardiogram was done. Cardiology felt like with her good exercise tolerance and no symptoms no further testing was required. My concern is that we still do not know if these EKG changes are related to myocardial ischemia. Prior to undergoing a general anesthetic I would like this specific question answered. Does she have myocardium at risk?

## 2013-05-28 NOTE — Telephone Encounter (Signed)
Kayla Rojas calling to confirm fax number to send fax.  Cardiology has notified hospital that clearance had been faxed to [place].  Advised Kayla Rojas it has been received and will fax now. Chart and cardiology notes to Dr Hyacinth Meeker.

## 2013-05-29 ENCOUNTER — Telehealth: Payer: Self-pay | Admitting: *Deleted

## 2013-05-29 NOTE — Telephone Encounter (Signed)
Patient notified that per anesthesia dept at [facility], needs stress test to clear for surgery.  She is notified that message has been left for Amy at [facility] cardiology.

## 2013-05-29 NOTE — Telephone Encounter (Signed)
Just making sure this is documented.  Dr. Malen Gauze, Anesthesiology, at [facility] wants pt to have stress test before proceeding with surgery.  We discussed yesterday.

## 2013-05-29 NOTE — Telephone Encounter (Signed)
LM on Amy VM of need for stress test to clear for surgery per anesthesia.

## 2013-06-01 ENCOUNTER — Telehealth: Payer: Self-pay | Admitting: Obstetrics & Gynecology

## 2013-06-01 ENCOUNTER — Ambulatory Visit (INDEPENDENT_AMBULATORY_CARE_PROVIDER_SITE_OTHER): Payer: 59 | Admitting: Obstetrics & Gynecology

## 2013-06-01 ENCOUNTER — Other Ambulatory Visit (HOSPITAL_COMMUNITY): Payer: 59

## 2013-06-01 VITALS — BP 134/86 | HR 64 | Resp 20 | Ht 64.5 in | Wt 190.8 lb

## 2013-06-01 DIAGNOSIS — N9489 Other specified conditions associated with female genital organs and menstrual cycle: Secondary | ICD-10-CM

## 2013-06-01 MED ORDER — NAPROXEN SODIUM 220 MG PO TABS: 440.0000 mg | ORAL_TABLET | Freq: Two times a day (BID) | ORAL | Status: DC

## 2013-06-01 NOTE — Telephone Encounter (Signed)
LVM advising patient of total amount due at check-in. She needs to pay her past due balance and surgery prepayment.

## 2013-06-01 NOTE — Telephone Encounter (Signed)
See next phone note for documentation and call to schedule stress test.

## 2013-06-01 NOTE — Patient Instructions (Addendum)
Nothing to eat after midnight the night before your procedure.

## 2013-06-01 NOTE — Progress Notes (Signed)
47 y.o. [id number] MarriedAfrican American female here for discussion of upcoming procedure diagnostic laparoscopy  planned due to pelvic pain.  Pain is worse a few days before cycle and starts to improve as soon as cycle starts.  Patient and I have discussed that this is a typical pain cycle for a woman with endometriosis.  Patient has experieicned this patinet for many years although ti has worsened in the last few years.  She has discussed this multiple time with NP, Shirlyn Goltz.  Starting back in 2008.  At that time, ultrasound showed normal 10.7 x 4.8 x 6.1cm uterus with normal ovaries.  However a gallbaldder filled with stones was seen.  She did undergo a cholescystectomy in 3/11 which did not help pain.  Because of continued pain, a CT (3/13) was performed showing normal pelvis but showed "a complex fluid collection anterior to the iliopsoas muscle compatible with bursitis".  She underwent a biopsy of this which revealed no fluid and therefore no pathology was present.  Patient has been contemplating this for over a year now and has decided to proceed.  Dr. Malen Gauze called earlier this week to inform me he is not satisfied with her pre-op clearance from cardiology.  This surgery was planned earlier this year but cancelled when she had an abnormal EKG.  Pt was seen by cardiology and had an echo.  Dr. Malen Gauze requests pt have stress test.  Pt is aware of this.  Has not decided if she will proceed with this or not. Understands that if she doesn't, surgery will be cancelled.  Procedure d/w pt.  Hospital stay, recovery and pain management all discussed.  Risks discussed including but not limited to bleeding, risk of bowel/bladder/ureteral/vascular injury discussed as well as possible need for additional surgery if injury does occur discussed.  DVT/PE and rare risk of death discussed.  My actual complications with prior surgeries discussed.  Hernia formation discussed.  Positioning and incision locations discussed.   Pt aware surgery could be negative.  If endometriosis present, will biopsy and treat if possible.  All questions answered.    Ob Hx:   Patient's last menstrual period was 05/25/2013.          Sexually active: yes Birth control: bilateral tubal ligation Last pap:11/20/11 WNL/negative HR HPV Tobacco: former smoker  Past Medical History  Diagnosis Date  . Hypertension   . History of abnormal Pap smear   . Vulvar vestibulitis 2005  . Diabetes mellitus     controlled by diet  . 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 Induced abortion    . Cholestectomy    . Bunionectomy    . Cesarean section    . Tubal ligation  2000    Allergies: Review of patient's allergies indicates no known allergies.  Current Outpatient Prescriptions  Medication Sig Dispense Refill  . acetaminophen (TYLENOL) 500 MG tablet Take 500 mg by mouth every 6 (six) hours as needed. For pain      . atenolol (TENORMIN) 50 MG tablet Take 50 mg by mouth daily.      Marland Kitchen CALCIUM PO Take 1,000 mg by mouth as needed.      . hydrochlorothiazide (HYDRODIURIL) 25 MG tablet Take 12.5 mg by mouth daily.      Marland Kitchen losartan (COZAAR) 50 MG tablet Take 50 mg by mouth daily.      . Multiple Vitamin (MULTIVITAMIN WITH  MINERALS) TABS tablet Take 1 tablet by mouth daily.      . naproxen sodium (ANAPROX) 220 MG tablet Take 440 mg by mouth daily as needed (for cramps).      Marland Kitchen OVER THE COUNTER MEDICATION 1 tablet as needed (Slim- Weight loss herbal).       No current facility-administered medications for this visit.    ROS: A comprehensive review of systems was negative.  Exam:    LMP 05/25/2013  General appearance: alert and cooperative Head: Normocephalic, without obvious abnormality, atraumatic Neck: supple, symmetrical, trachea midline and thyroid not enlarged, symmetric, no tenderness/mass/nodules Lungs: clear to auscultation bilaterally Heart: regular  rate and rhythm, S1, S2 normal, no murmur, click, rub or gallop Abdomen: soft, non-tender; bowel sounds normal; no masses,  no organomegaly Extremities: extremities normal, atraumatic, no cyanosis or edema Skin: Skin color, texture, turgor normal. No rashes or lesions Lymph nodes: Cervical, supraclavicular, and axillary nodes normal. no inguinal nodes palpated Neurologic: Grossly normal  Pelvic: External genitalia:  no lesions              Urethra: normal appearing urethra with no masses, tenderness or lesions              Bartholins and Skenes: normal                 Vagina: normal appearing vagina with normal color and discharge, no lesions              Cervix: normal appearance              Pap taken: no        Bimanual Exam:  Uterus:  uterus is normal size, shape, consistency and nontender                                      Adnexa:    normal adnexa in size, nontender and no masses                                      Rectovaginal: Deferred                                      Anus:  defer exam  A: Pelvic Pain    P:  Diagnostic laparoscopy planned Rx for Motrin and Percocet usually given but not today as pt hasn't decided if will proceed.   Pt knows need stress test or surgery will be cancelled.

## 2013-06-04 NOTE — Telephone Encounter (Signed)
Patient going to pcp for a visit with Mila Palmer at [facility] and is requesting an order be sent there for labs in relation to upcoming surgery.  Appointment at [facility] 06/05/13 at 8:00 AM. [phone] phone at [facility]

## 2013-06-04 NOTE — Telephone Encounter (Signed)
Is it OK for pt to have preop labwork drawn in the morning at her cardiology appt? What labs do you need and I will call or fax them.

## 2013-06-05 NOTE — Telephone Encounter (Signed)
Order written for triage nurse to fax.

## 2013-06-08 ENCOUNTER — Other Ambulatory Visit (HOSPITAL_COMMUNITY): Payer: Self-pay | Admitting: Interventional Cardiology

## 2013-06-08 DIAGNOSIS — R9431 Abnormal electrocardiogram [ECG] [EKG]: Secondary | ICD-10-CM

## 2013-06-08 DIAGNOSIS — Z01810 Encounter for preprocedural cardiovascular examination: Secondary | ICD-10-CM

## 2013-06-08 NOTE — Telephone Encounter (Signed)
Patient was supposed to have a stress test done before surgery on 06/16/13 but the office said they can not get her in until 06/17/13.

## 2013-06-09 NOTE — Telephone Encounter (Signed)
Detailed message left on Amy VM that we need earlier appt for stress test. Patient update.

## 2013-06-09 NOTE — Telephone Encounter (Signed)
Routed to [facility] 06/09/13 cm

## 2013-06-10 NOTE — Telephone Encounter (Signed)
Call to [facility] in PAT advised her that cardiologist ordered/scheduled the "stress echo" for tomorrow.  Want to confirm with Dr Malen Gauze that this is what is needed.  Steward Drone called me back and left message that Dr Brayton Caves will be anesthesiologist on 06-16-13 and he says this test will be perfect. They will be waiting for results.

## 2013-06-10 NOTE — Telephone Encounter (Signed)
Spoke to [place] and they have been able to schedule stress echo for tomorrow at 1130 and they have notified the patient.

## 2013-06-10 NOTE — Telephone Encounter (Signed)
See next telephone note ?

## 2013-06-11 ENCOUNTER — Ambulatory Visit (HOSPITAL_COMMUNITY)
Admission: RE | Admit: 2013-06-11 | Discharge: 2013-06-11 | Disposition: A | Discharge status: Home / Self Care | Payer: 59 | Source: Ambulatory Visit | Attending: Interventional Cardiology | Admitting: Interventional Cardiology

## 2013-06-11 DIAGNOSIS — Z01810 Encounter for preprocedural cardiovascular examination: Secondary | ICD-10-CM

## 2013-06-11 DIAGNOSIS — R9431 Abnormal electrocardiogram [ECG] [EKG]: Secondary | ICD-10-CM

## 2013-06-11 NOTE — Progress Notes (Signed)
Echocardiogram Echocardiogram Stress Test has been performed.  Kayla Rojas 06/11/2013, 12:33 PM

## 2013-06-11 NOTE — Progress Notes (Deleted)
Echocardiogram 2D Echocardiogram has been performed.  Kayla Rojas 06/11/2013, 12:33 PM

## 2013-06-12 ENCOUNTER — Telehealth: Payer: Self-pay | Admitting: *Deleted

## 2013-06-12 NOTE — Telephone Encounter (Signed)
Stress echo test results received via fax.  Sent to PAT dept at [facility].

## 2013-06-12 NOTE — Progress Notes (Signed)
Stress Echo results reviewed and approved by Cristela Blue, MD.

## 2013-06-12 NOTE — Telephone Encounter (Signed)
Patient calling To see if we needed anything else before surgery on Tuesday.

## 2013-06-12 NOTE — Telephone Encounter (Signed)
Call to hospital, they did not receive my fax but stress echo and labs from PCP faxed to [facility] PAT and call received confirming receiot (msg was left with receptionist). Patient updated.

## 2013-06-16 ENCOUNTER — Encounter: Payer: Self-pay | Admitting: Obstetrics & Gynecology

## 2013-06-16 ENCOUNTER — Encounter (HOSPITAL_COMMUNITY): Payer: Self-pay | Admitting: Anesthesiology

## 2013-06-16 ENCOUNTER — Ambulatory Visit (HOSPITAL_COMMUNITY)
Admission: RE | Admit: 2013-06-16 | Discharge: 2013-06-16 | Disposition: A | Discharge status: Home / Self Care | Payer: 59 | Source: Ambulatory Visit | Attending: Obstetrics & Gynecology | Admitting: Obstetrics & Gynecology

## 2013-06-16 ENCOUNTER — Ambulatory Visit (HOSPITAL_COMMUNITY): Payer: 59 | Admitting: Anesthesiology

## 2013-06-16 ENCOUNTER — Encounter (HOSPITAL_COMMUNITY)
Admission: RE | Disposition: A | Discharge status: Home / Self Care | Payer: Self-pay | Source: Ambulatory Visit | Attending: Obstetrics & Gynecology

## 2013-06-16 DIAGNOSIS — N949 Unspecified condition associated with female genital organs and menstrual cycle: Secondary | ICD-10-CM | POA: Insufficient documentation

## 2013-06-16 DIAGNOSIS — N801 Endometriosis of ovary: Secondary | ICD-10-CM

## 2013-06-16 DIAGNOSIS — I10 Essential (primary) hypertension: Secondary | ICD-10-CM | POA: Insufficient documentation

## 2013-06-16 DIAGNOSIS — G8929 Other chronic pain: Secondary | ICD-10-CM | POA: Insufficient documentation

## 2013-06-16 DIAGNOSIS — N80109 Endometriosis of ovary, unspecified side, unspecified depth: Secondary | ICD-10-CM

## 2013-06-16 DIAGNOSIS — R1032 Left lower quadrant pain: Secondary | ICD-10-CM

## 2013-06-16 DIAGNOSIS — E119 Type 2 diabetes mellitus without complications: Secondary | ICD-10-CM | POA: Insufficient documentation

## 2013-06-16 DIAGNOSIS — N9489 Other specified conditions associated with female genital organs and menstrual cycle: Secondary | ICD-10-CM | POA: Insufficient documentation

## 2013-06-16 DIAGNOSIS — N8000 Endometriosis of the uterus, unspecified: Secondary | ICD-10-CM | POA: Insufficient documentation

## 2013-06-16 HISTORY — PX: LAPAROSCOPY: SHX197

## 2013-06-16 LAB — PREGNANCY, URINE: Preg Test, Ur: NEGATIVE

## 2013-06-16 SURGERY — LAPAROSCOPY, DIAGNOSTIC
Anesthesia: General | Site: Abdomen | Wound class: Clean Contaminated

## 2013-06-16 MED ORDER — LACTATED RINGERS IV SOLN
INTRAVENOUS | Status: DC
  Administered 2013-06-16 (×2): via INTRAVENOUS

## 2013-06-16 MED ORDER — FENTANYL CITRATE 0.05 MG/ML IJ SOLN
INTRAMUSCULAR | Status: AC
  Filled 2013-06-16: qty 5

## 2013-06-16 MED ORDER — NEOSTIGMINE METHYLSULFATE 1 MG/ML IJ SOLN
INTRAMUSCULAR | Status: DC | PRN
  Administered 2013-06-16: 3 mg via INTRAVENOUS

## 2013-06-16 MED ORDER — KETOROLAC TROMETHAMINE 30 MG/ML IJ SOLN
INTRAMUSCULAR | Status: AC
  Filled 2013-06-16: qty 1

## 2013-06-16 MED ORDER — BUPIVACAINE HCL (PF) 0.25 % IJ SOLN
INTRAMUSCULAR | Status: DC | PRN
  Administered 2013-06-16: 5 mL

## 2013-06-16 MED ORDER — FENTANYL CITRATE 0.05 MG/ML IJ SOLN
INTRAMUSCULAR | Status: DC | PRN
  Administered 2013-06-16: 100 ug via INTRAVENOUS
  Administered 2013-06-16 (×3): 50 ug via INTRAVENOUS

## 2013-06-16 MED ORDER — MUPIROCIN 2 % EX OINT: TOPICAL_OINTMENT | Freq: Two times a day (BID) | CUTANEOUS | Status: DC

## 2013-06-16 MED ORDER — GLYCOPYRROLATE 0.2 MG/ML IJ SOLN
INTRAMUSCULAR | Status: AC
  Filled 2013-06-16: qty 2

## 2013-06-16 MED ORDER — MIDAZOLAM HCL 2 MG/2ML IJ SOLN
INTRAMUSCULAR | Status: DC | PRN
  Administered 2013-06-16: 2 mg via INTRAVENOUS

## 2013-06-16 MED ORDER — ONDANSETRON HCL 4 MG/2ML IJ SOLN
INTRAMUSCULAR | Status: DC | PRN
  Administered 2013-06-16: 4 mg via INTRAVENOUS

## 2013-06-16 MED ORDER — LIDOCAINE HCL (CARDIAC) 20 MG/ML IV SOLN
INTRAVENOUS | Status: DC | PRN
  Administered 2013-06-16: 60 mg via INTRAVENOUS

## 2013-06-16 MED ORDER — PROPOFOL 10 MG/ML IV EMUL
INTRAVENOUS | Status: AC
  Filled 2013-06-16: qty 20

## 2013-06-16 MED ORDER — BUPIVACAINE HCL (PF) 0.25 % IJ SOLN
INTRAMUSCULAR | Status: AC
  Filled 2013-06-16: qty 30

## 2013-06-16 MED ORDER — KETOROLAC TROMETHAMINE 30 MG/ML IJ SOLN
INTRAMUSCULAR | Status: DC | PRN
  Administered 2013-06-16: 30 mg via INTRAVENOUS
  Administered 2013-06-16: 30 mg via INTRAMUSCULAR

## 2013-06-16 MED ORDER — PROMETHAZINE HCL 25 MG/ML IJ SOLN: 6.2500 mg | INTRAMUSCULAR | Status: DC | PRN

## 2013-06-16 MED ORDER — DEXTROSE 5 % IV SOLN
2.0000 g | INTRAVENOUS | Status: AC
  Administered 2013-06-16: 2 g via INTRAVENOUS
  Filled 2013-06-16: qty 2

## 2013-06-16 MED ORDER — MIDAZOLAM HCL 2 MG/2ML IJ SOLN: 0.5000 mg | Freq: Once | INTRAMUSCULAR | Status: DC | PRN

## 2013-06-16 MED ORDER — ROCURONIUM BROMIDE 50 MG/5ML IV SOLN
INTRAVENOUS | Status: AC
  Filled 2013-06-16: qty 1

## 2013-06-16 MED ORDER — ROCURONIUM BROMIDE 100 MG/10ML IV SOLN
INTRAVENOUS | Status: DC | PRN
  Administered 2013-06-16: 40 mg via INTRAVENOUS

## 2013-06-16 MED ORDER — MEPERIDINE HCL 25 MG/ML IJ SOLN: 6.2500 mg | INTRAMUSCULAR | Status: DC | PRN

## 2013-06-16 MED ORDER — NEOSTIGMINE METHYLSULFATE 1 MG/ML IJ SOLN
INTRAMUSCULAR | Status: AC
  Filled 2013-06-16: qty 1

## 2013-06-16 MED ORDER — MIDAZOLAM HCL 2 MG/2ML IJ SOLN
INTRAMUSCULAR | Status: AC
  Filled 2013-06-16: qty 2

## 2013-06-16 MED ORDER — ONDANSETRON HCL 4 MG/2ML IJ SOLN
INTRAMUSCULAR | Status: AC
  Filled 2013-06-16: qty 2

## 2013-06-16 MED ORDER — DEXAMETHASONE SODIUM PHOSPHATE 10 MG/ML IJ SOLN
INTRAMUSCULAR | Status: AC
  Filled 2013-06-16: qty 1

## 2013-06-16 MED ORDER — SODIUM CHLORIDE 0.9 % IJ SOLN
INTRAMUSCULAR | Status: DC | PRN
  Administered 2013-06-16: 10 mL

## 2013-06-16 MED ORDER — GLYCOPYRROLATE 0.2 MG/ML IJ SOLN
INTRAMUSCULAR | Status: DC | PRN
  Administered 2013-06-16: .2 mg via INTRAVENOUS
  Administered 2013-06-16: 0.4 mg via INTRAVENOUS

## 2013-06-16 MED ORDER — FENTANYL CITRATE 0.05 MG/ML IJ SOLN: 25.0000 ug | INTRAMUSCULAR | Status: DC | PRN

## 2013-06-16 MED ORDER — PROPOFOL 10 MG/ML IV BOLUS
INTRAVENOUS | Status: DC | PRN
  Administered 2013-06-16: 150 mg via INTRAVENOUS

## 2013-06-16 MED ORDER — LACTATED RINGERS IV SOLN: INTRAVENOUS | Status: DC

## 2013-06-16 MED ORDER — HYDROCODONE-ACETAMINOPHEN 5-300 MG PO TABS: 1.0000 | ORAL_TABLET | ORAL | Status: DC

## 2013-06-16 MED ORDER — LIDOCAINE HCL (CARDIAC) 20 MG/ML IV SOLN
INTRAVENOUS | Status: AC
  Filled 2013-06-16: qty 5

## 2013-06-16 MED ORDER — DEXAMETHASONE SODIUM PHOSPHATE 10 MG/ML IJ SOLN
INTRAMUSCULAR | Status: DC | PRN
  Administered 2013-06-16: 5 mg via INTRAVENOUS

## 2013-06-16 SURGICAL SUPPLY — 34 items
ADH SKN CLS APL DERMABOND .7 (GAUZE/BANDAGES/DRESSINGS) ×2
APL SKNCLS STERI-STRIP NONHPOA (GAUZE/BANDAGES/DRESSINGS)
BAG SPEC RTRVL LRG 6X4 10 (ENDOMECHANICALS)
BENZOIN TINCTURE PRP APPL 2/3 (GAUZE/BANDAGES/DRESSINGS) IMPLANT
CABLE HIGH FREQUENCY MONO STRZ (ELECTRODE) ×3 IMPLANT
CATH ROBINSON RED A/P 16FR (CATHETERS) IMPLANT
CHLORAPREP W/TINT 26ML (MISCELLANEOUS) ×3 IMPLANT
CLOTH BEACON ORANGE TIMEOUT ST (SAFETY) ×3 IMPLANT
DERMABOND ADVANCED (GAUZE/BANDAGES/DRESSINGS) ×1
DERMABOND ADVANCED .7 DNX12 (GAUZE/BANDAGES/DRESSINGS) ×1 IMPLANT
GLOVE BIOGEL PI IND STRL 7.0 (GLOVE) ×4 IMPLANT
GLOVE BIOGEL PI INDICATOR 7.0 (GLOVE) ×2
GLOVE ECLIPSE 6.5 STRL STRAW (GLOVE) ×6 IMPLANT
GOWN PREVENTION PLUS LG XLONG (DISPOSABLE) ×3 IMPLANT
GOWN PREVENTION PLUS XLARGE (GOWN DISPOSABLE) ×3 IMPLANT
NEEDLE INSUFFLATION 120MM (ENDOMECHANICALS) ×3 IMPLANT
PACK LAPAROSCOPY BASIN (CUSTOM PROCEDURE TRAY) ×3 IMPLANT
POUCH SPECIMEN RETRIEVAL 10MM (ENDOMECHANICALS) IMPLANT
PROTECTOR NERVE ULNAR (MISCELLANEOUS) ×3 IMPLANT
SEALER TISSUE G2 CVD JAW 35 (ENDOMECHANICALS) IMPLANT
SEALER TISSUE G2 CVD JAW 45CM (ENDOMECHANICALS)
SET IRRIG TUBING LAPAROSCOPIC (IRRIGATION / IRRIGATOR) IMPLANT
STRIP CLOSURE SKIN 1/2X4 (GAUZE/BANDAGES/DRESSINGS) IMPLANT
STRIP CLOSURE SKIN 1/4X4 (GAUZE/BANDAGES/DRESSINGS) ×3 IMPLANT
SUT VICRYL 0 UR6 27IN ABS (SUTURE) IMPLANT
SUT VICRYL 4-0 PS2 18IN ABS (SUTURE) ×3 IMPLANT
SYR 30ML LL (SYRINGE) IMPLANT
TOWEL OR 17X24 6PK STRL BLUE (TOWEL DISPOSABLE) ×6 IMPLANT
TRAY FOLEY CATH 14FR (SET/KITS/TRAYS/PACK) ×3 IMPLANT
TROCAR BALLN 12MMX100 BLUNT (TROCAR) IMPLANT
TROCAR XCEL NON-BLD 11X100MML (ENDOMECHANICALS) IMPLANT
TROCAR XCEL NON-BLD 5MMX100MML (ENDOMECHANICALS) ×2 IMPLANT
WARMER LAPAROSCOPE (MISCELLANEOUS) ×3 IMPLANT
WATER STERILE IRR 1000ML POUR (IV SOLUTION) ×3 IMPLANT

## 2013-06-16 NOTE — OR Nursing (Signed)
Reentered patient position information after updating the patients procedure

## 2013-06-16 NOTE — Transfer of Care (Signed)
Immediate Anesthesia Transfer of Care Note  Patient: Kayla Rojas  Procedure(s) Performed: Procedure(s): LAPAROSCOPY DIAGNOSTIC (N/A)  Patient Location: PACU  Anesthesia Type:General  Level of Consciousness: awake, alert  and oriented  Airway & Oxygen Therapy: Patient Spontanous Breathing and Patient connected to nasal cannula oxygen  Post-op Assessment: Report given to PACU RN, Post -op Vital signs reviewed and stable and Patient moving all extremities X 4  Post vital signs: Reviewed and stable  Complications: No apparent anesthesia complications

## 2013-06-16 NOTE — Anesthesia Preprocedure Evaluation (Signed)
Anesthesia Evaluation  Patient identified by MRN, date of birth, ID band Patient awake    Reviewed: Allergy & Precautions, H&P , Patient's Chart, lab work & pertinent test results, reviewed documented beta blocker date and time   History of Anesthesia Complications Negative for: history of anesthetic complications  Airway Mallampati: II TM Distance: >3 FB Neck ROM: full    Dental no notable dental hx.    Pulmonary neg pulmonary ROS,  breath sounds clear to auscultation  Pulmonary exam normal       Cardiovascular Exercise Tolerance: Good hypertension, negative cardio ROS  Rhythm:regular Rate:Normal     Neuro/Psych  Neuromuscular disease negative neurological ROS  negative psych ROS   GI/Hepatic negative GI ROS, Neg liver ROS,   Endo/Other  negative endocrine ROSdiabetes  Renal/GU negative Renal ROS     Musculoskeletal   Abdominal   Peds  Hematology negative hematology ROS (+)   Anesthesia Other Findings   Reproductive/Obstetrics negative OB ROS                           Anesthesia Physical Anesthesia Plan  ASA: III  Anesthesia Plan: General ETT   Post-op Pain Management:    Induction:   Airway Management Planned:   Additional Equipment:   Intra-op Plan:   Post-operative Plan:   Informed Consent: I have reviewed the patients History and Physical, chart, labs and discussed the procedure including the risks, benefits and alternatives for the proposed anesthesia with the patient or authorized representative who has indicated his/her understanding and acceptance.   Dental Advisory Given  Plan Discussed with: CRNA and Surgeon  Anesthesia Plan Comments:         Anesthesia Quick Evaluation

## 2013-06-16 NOTE — Anesthesia Postprocedure Evaluation (Signed)
Anesthesia Post Note  Patient: Kayla Rojas  Procedure(s) Performed: Procedure(s) (LRB): LAPAROSCOPY DIAGNOSTIC (N/A)  Anesthesia type: General  Patient location: PACU  Post pain: Pain level controlled  Post assessment: Post-op Vital signs reviewed  Last Vitals:  Filed Vitals:   06/16/13 1100  BP: 106/62  Pulse: 73  Temp:   Resp: 20    Post vital signs: Reviewed  Level of consciousness: sedated  Complications: No apparent anesthesia complications

## 2013-06-16 NOTE — H&P (Signed)
Kayla Rojas is an 47 y.o. female G2P2 with cyclical chronic pelvic pain here for diagnostic laparoscopy.  Pt has been evaluated by Dr. Loreta Ave with negative evaluation except for a 2 x 3cm cystic lesion found on psoas muscle on CT scan.  Biopsy revealed insufficient sample for evaluation.  Patient was cleared by cardiology.  Risks/benefits discussed in office.  Here and ready to proceed.    Pertinent Gynecological History: Menses: flow is moderate Bleeding: regular Contraception: tubal ligation DES exposure: denies Blood transfusions: none Sexually transmitted diseases: no past history Previous GYN Procedures: none  Last mammogram: normal Date: 3/14 Last pap: normal Date: 2/13 OB History: G2, P2   Menstrual History: No LMP recorded.    Past Medical History  Diagnosis Date  . Hypertension   . History of abnormal Pap smear   . Vulvar vestibulitis 2005  . Diabetes mellitus     controlled by diet  . 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 Induced abortion    . Cholestectomy    . Bunionectomy    . Cesarean section    . Tubal ligation  2000    Family History  Problem Relation Age of Onset  . Other Other   . Diabetes Father   . Diabetes Maternal Uncle   . Hypertension Maternal Uncle   . Diabetes Paternal Aunt   . Hypertension Paternal Aunt   . Diabetes Maternal Grandmother   . Heart disease Maternal Aunt     CABG  . Heart disease Maternal Grandmother     CVA    Social History:  reports that she has quit smoking. She does not have any smokeless tobacco history on file. She reports that  drinks alcohol. She reports that she does not use illicit drugs.  Allergies: No Known Allergies  Prescriptions prior to admission  Medication Sig Dispense Refill  . atenolol (TENORMIN) 50 MG tablet Take 50 mg by mouth daily.      . hydrochlorothiazide (HYDRODIURIL) 25 MG tablet Take 12.5 mg by  mouth daily.      Marland Kitchen losartan (COZAAR) 50 MG tablet Take 50 mg by mouth daily.      Marland Kitchen acetaminophen (TYLENOL) 500 MG tablet Take 500 mg by mouth every 6 (six) hours as needed. For pain      . CALCIUM PO Take 1,000 mg by mouth as needed.      . Multiple Vitamin (MULTIVITAMIN WITH MINERALS) TABS tablet Take 1 tablet by mouth daily.      . naproxen sodium (ANAPROX) 220 MG tablet Take 2 tablets (440 mg total) by mouth 2 (two) times daily.  60 tablet  1  . OVER THE COUNTER MEDICATION 1 tablet as needed (Slim- Weight loss herbal).      . Probiotic Product (PROBIOTIC DAILY PO) Take by mouth.        Review of Systems  All other systems reviewed and are negative.    Blood pressure 126/78, pulse 72, temperature 99 F (37.2 C), temperature source Oral, resp. rate 20, SpO2 99.00%. Physical Exam  Constitutional: She is oriented to person, place, and time. She appears well-developed and well-nourished.  Neck: Normal range of motion. Neck supple.  Cardiovascular: Normal rate and regular rhythm.   Respiratory: Effort normal and breath sounds normal.  GI: Soft. Bowel sounds are normal.  Neurological: She is alert and oriented to person, place, and time.  Skin: Skin is warm and dry.  Psychiatric: She has a normal mood and affect.    Results for orders placed during the hospital encounter of 06/16/13 (from the past 24 hour(s))  PREGNANCY, URINE     Status: None   Collection Time    06/16/13  8:54 AM      Result Value Range   Preg Test, Ur NEGATIVE  NEGATIVE  GLUCOSE, CAPILLARY     Status: Abnormal   Collection Time    06/16/13  9:12 AM      Result Value Range   Glucose-Capillary 125 (*) 70 - 99 mg/dL    No results found.  Assessment/Plan: 47 y.o. female G2P2 with cyclical chronic pelvic pain here for diagnostic laparoscopy.  All questions answered.  Ready to proceed.  Valentina Shaggy [facility] 06/16/2013, 9:34 AM

## 2013-06-16 NOTE — Preoperative (Signed)
Beta Blockers   Reason not to administer Beta Blockers:Took her usual dose atenolol DOS

## 2013-06-16 NOTE — Op Note (Signed)
06/16/2013  11:15 AM  PATIENT:  Kayla Rojas  47 y.o. female  PRE-OPERATIVE DIAGNOSIS:  chronic left lower quadrant pain  POST-OPERATIVE DIAGNOSIS:  adenomyosis, pelvic venous congestion  PROCEDURE:  Procedure(s): LAPAROSCOPY DIAGNOSTIC  SURGEON:  Irma Roulhac SUZANNE  ASSISTANTS: Douglass Rivers, MD   ANESTHESIA:   general  ESTIMATED BLOOD LOSS: 50cc  BLOOD ADMINISTERED:none   FLUIDS: 1200ccLR  UOP: 400cc clear  SPECIMEN:  none  DISPOSITION OF SPECIMEN:  N/A  FINDINGS: Adhesions beneath umbilicus of omentum, no evidence of endometriosis, normal right and left ovaries,  Normal fallopian tubes both s/p BTL, could not visualize appendix, bulky uterus consistent with adenomyosis, pelvic venous congestion, absent gall bladder, normal appearing liver, normal appearing stomach  DESCRIPTION OF OPERATION: Patient is taken to the operating room. She is placed in the supine position. She is a running IV in place. Informed consent was present on the chart. SCDs on her lower extremities and functioning properly. General endotracheal anesthesia was administered by the anesthesia staff without difficulty. Once adequate anesthesia was confirmed the legs are placed in the low lithotomy position in [place] stirrups. Her arms were tucked by the side.   Chlor prep was then used to prep the abdomen and Betadine was used to prep the inner thighs, perineum and vagina. Once 3 minutes had past the patient was draped in a normal standard fashion. The legs were lifted to the high lithotomy position. The cervix was visualized by placing a bivalve speculum in the vaginal. The anterior lip of the cervix was grasped with a single toothed tenaculum. A Hulka clamp was placed in the endocervical canal as a means of manipulating the uterus during the procedure. The tenaculum was removed. There is also good manipulation of the uterus. The speculum was removed as well. A Foley catheter was placed to straight  drain. Legs were lowered to the low lithotomy position and attention was turned the abdomen.   Attention was turned to the abdomen.  In the midclavicular line, 2 cm below the costal margin, the skin was anesthetized with .025% Marcaine. The skin was incised with a #11 blade. The abdomen was elevated and 5mm laparoscope attached to a 5mm non-bladed trochar with optiview tip was passed through the abdominal wall layers.  Once intraperitoneal placement was confirmed, pneumoperitoneum was achieved with low flow of CO2 gas.  Port site locations for a RLQ port were chosen. The skin was transilluminated and the skin was anesthetized with ropivacaine mixture. 5mm skin incision was made and 5mm nondisposable trocar port was passed directly into the abdomen.  Pt was placed in Trendelenburg positioning. Pelvis and upper abdomen was surveyed with findings as above.  Abdomen and pelvis appeared normal except for adhesions beneath the umbilicus.  These were only omental adhesions and were left alone.  There was pelvic venous congestion and a bulky uterus consistent with adenomyosis.   No other procedures were consented for and no endometriosis was present.  Pt was taken out of Trendelenburg positioning.  Procedure was ended.  Pneumoperitoneum was relieved. RLQ port was removed under direct visualization of the laparosocpe.  The CRNA gave the pt several good breaths to try and get any remaining gas from the abdomen.  The LUQ port was removed.  Skin was closed with 3.0 Vicryl and then Dermabond was applied.    Attention was then turned vaginally. Instruments and foley catheter were removed.  Legs were placed back in the supine position.   Sponge, lap, needle, and instrument counts  were correct x2. Patient tolerated the procedure very well, awakened from anesthesia and taken to the recovery room in stable condition.  COUNTS:  YES  PLAN OF CARE: Transfer to PACU

## 2013-06-17 ENCOUNTER — Encounter (HOSPITAL_COMMUNITY): Payer: Self-pay | Admitting: Obstetrics & Gynecology

## 2013-06-17 ENCOUNTER — Ambulatory Visit (HOSPITAL_COMMUNITY): Payer: 59

## 2013-06-22 ENCOUNTER — Telehealth: Payer: Self-pay | Admitting: *Deleted

## 2013-06-22 NOTE — Telephone Encounter (Signed)
Follow-up call to patient, checking on where to fax return to work note.  Patient states she returned to work today and felt fine and does not need a note.  Call PRN.

## 2013-06-25 NOTE — Telephone Encounter (Signed)
Pt having some vaginal irritation and would nurse to give her a call.

## 2013-06-25 NOTE — Telephone Encounter (Signed)
Patient calling with vaginal irritation and burning skin in vaginal area and unsure if it is BV, as patient states she has a hx of BV and feels this may be related.  She feels skin irritation may be r/t using pads instead of tampons as she is very sensitive.  Denies fevers or abdominal pain. Denies vaginal discharge, denies dysuria.  Wanted to know if she can use creams or powders to treat vaginal skin. I advised to hold off on any otc treatment until she has seen Elease Hashimoto Rolen-Grubb or Dr. Hyacinth Meeker for post op.  Offered OV and patient accepted, scheduled for tomorrow with Lauro Franklin, FNP at 1530. Patient agreeable to plan.   Routing to provider for review.

## 2013-06-26 ENCOUNTER — Ambulatory Visit: Payer: Self-pay | Admitting: Gynecology

## 2013-06-26 MED ORDER — METRONIDAZOLE 0.75 % VA GEL: VAGINAL | Status: DC

## 2013-06-26 NOTE — Telephone Encounter (Signed)
Message left to return call to [facility] at [phone].   Need to re-schedule for today, Can come to see Dr. Leeanne Mannan noon.

## 2013-06-26 NOTE — Telephone Encounter (Signed)
Spoke with patient she cannot come earlier today to be seen by another provider. She states she will wait until her appointment on Monday. Advised patient to call back if she changes her mind or symptoms worsen. Patient agreeable.

## 2013-06-26 NOTE — Telephone Encounter (Signed)
Spoke with patient.  She agrees to Metrogel and given instructions on use.  Patient has f/u with Dr. Hyacinth Meeker Monday 06/29/13

## 2013-06-26 NOTE — Telephone Encounter (Signed)
Ok to treat with either metrogel 0.75%, one applicator qhs for five days or oral flagyl 500mg  bid x 7 days--whatever works better for her.  i would prefer to see her if possible early next week, if possible.  Thanks.

## 2013-06-26 NOTE — Addendum Note (Signed)
Addended by: Joeseph Amor on: 06/26/2013 12:01 PM   Modules accepted: Orders

## 2013-06-29 ENCOUNTER — Ambulatory Visit (INDEPENDENT_AMBULATORY_CARE_PROVIDER_SITE_OTHER): Payer: 59 | Admitting: Obstetrics & Gynecology

## 2013-06-29 ENCOUNTER — Encounter: Payer: Self-pay | Admitting: Obstetrics & Gynecology

## 2013-06-29 VITALS — BP 138/88 | HR 64 | Resp 20 | Ht 64.5 in | Wt 191.2 lb

## 2013-06-29 DIAGNOSIS — Z9889 Other specified postprocedural states: Secondary | ICD-10-CM

## 2013-06-29 MED ORDER — FLUCONAZOLE 150 MG PO TABS: 150.0000 mg | ORAL_TABLET | Freq: Once | ORAL | Status: DC

## 2013-06-29 NOTE — Progress Notes (Signed)
Post Operative Visit  Procedure: Diagnostic Laparoscopy Days Post-op: 14  Subjective: Doing well.  No pain.  Never had pain.  Did have a skin reaction to the Betadine prep.  Hypersensitivity entered in allergy list.  She had dryness, irritation, and vaginal discharge.  Treated with Metrogel.  No feels like has Yeast infection.  Common for her.  Pictures reviewed.  Probable pelvic venous congestion and adenomyosis seen.  Normal bladder and bowel function.  Objective: BP 138/88  Pulse 64  Resp 20  Ht 5' 4.5" (1.638 m)  Wt 191 lb 3.2 oz (86.728 kg)  BMI 32.32 kg/m2  LMP 06/18/2013  EXAM General: alert and cooperative GI: soft, non-tender; bowel sounds normal; no masses,  no organomegaly and incision: clean, dry and intact Extremities: extremities normal, atraumatic, no cyanosis or edema Vaginal Bleeding: none, thick white d/c consistent with Yeast  Assessment: s/p diagnostic laparoscopy  Plan: Diflucan 150mg  po x 1, repeat 48 hrs.  #2/1 RF AEX with PG 3/15 already scheduled.

## 2013-06-29 NOTE — Patient Instructions (Signed)
Please call if you have any new problems/issues 

## 2013-10-25 ENCOUNTER — Encounter (HOSPITAL_COMMUNITY): Payer: Self-pay | Admitting: Emergency Medicine

## 2013-10-25 ENCOUNTER — Emergency Department (INDEPENDENT_AMBULATORY_CARE_PROVIDER_SITE_OTHER)
Admission: EM | Admit: 2013-10-25 | Discharge: 2013-10-25 | Disposition: A | Discharge status: Home / Self Care | Payer: 59 | Source: Home / Self Care | Attending: Family Medicine | Admitting: Family Medicine

## 2013-10-25 DIAGNOSIS — L02219 Cutaneous abscess of trunk, unspecified: Secondary | ICD-10-CM

## 2013-10-25 DIAGNOSIS — L03319 Cellulitis of trunk, unspecified: Secondary | ICD-10-CM

## 2013-10-25 DIAGNOSIS — L02213 Cutaneous abscess of chest wall: Secondary | ICD-10-CM

## 2013-10-25 MED ORDER — SULFAMETHOXAZOLE-TRIMETHOPRIM 800-160 MG PO TABS: 1.0000 | ORAL_TABLET | Freq: Two times a day (BID) | ORAL | Status: AC

## 2013-10-25 NOTE — Discharge Instructions (Signed)
Abscess An abscess is an infected area that contains a collection of pus and debris.It can occur in almost any part of the body. An abscess is also known as a furuncle or boil. CAUSES  An abscess occurs when tissue gets infected. This can occur from blockage of oil or sweat glands, infection of hair follicles, or a minor injury to the skin. As the body tries to fight the infection, pus collects in the area and creates pressure under the skin. This pressure causes pain. People with weakened immune systems have difficulty fighting infections and get certain abscesses more often.  SYMPTOMS Usually an abscess develops on the skin and becomes a painful mass that is red, warm, and tender. If the abscess forms under the skin, you may feel a moveable soft area under the skin. Some abscesses break open (rupture) on their own, but most will continue to get worse without care. The infection can spread deeper into the body and eventually into the bloodstream, causing you to feel ill.  DIAGNOSIS  Your caregiver will take your medical history and perform a physical exam. A sample of fluid may also be taken from the abscess to determine what is causing your infection. TREATMENT  Your caregiver may prescribe antibiotic medicines to fight the infection. However, taking antibiotics alone usually does not cure an abscess. Your caregiver may need to make a small cut (incision) in the abscess to drain the pus. In some cases, gauze is packed into the abscess to reduce pain and to continue draining the area. HOME CARE INSTRUCTIONS   Only take over-the-counter or prescription medicines for pain, discomfort, or fever as directed by your caregiver.  If you were prescribed antibiotics, take them as directed. Finish them even if you start to feel better.  If gauze is used, follow your caregiver's directions for changing the gauze.  To avoid spreading the infection:  Keep your draining abscess covered with a  bandage.  Wash your hands well.  Do not share personal care items, towels, or whirlpools with others.  Avoid skin contact with others.  Keep your skin and clothes clean around the abscess.  Keep all follow-up appointments as directed by your caregiver. SEEK MEDICAL CARE IF:   You have increased pain, swelling, redness, fluid drainage, or bleeding.  You have muscle aches, chills, or a general ill feeling.  You have a fever. MAKE SURE YOU:   Understand these instructions.  Will watch your condition.  Will get help right away if you are not doing well or get worse. Document Released: 06/20/2005 Document Revised: 03/11/2012 Document Reviewed: 11/23/2011 [facility] Patient Information 2014 [place].   Keep dressing in place and clean and dry. If falls off may replace. Take all of antibiotics. Return Tuesday for packing removal and wound check.

## 2013-10-25 NOTE — ED Provider Notes (Signed)
CSN: [id number]     Arrival date & time 10/25/13  1220 History   First MD Initiated Contact with Patient 10/25/13 1244     Chief Complaint  Patient presents with  . Recurrent Skin Infections   (Consider location/radiation/quality/duration/timing/severity/associated sxs/prior Treatment) HPI Comments: Patient presents with a "boil" under left breast for 5 days. Worsening despite warm compresses. No fever, chills or red streaking.   The history is provided by the patient.    Past Medical History  Diagnosis Date  . Hypertension   . History of abnormal Pap smear   . Vulvar vestibulitis 2005  . Diabetes mellitus     controlled by diet  . 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 Induced abortion    . Cholestectomy    . Bunionectomy    . Cesarean section    . Tubal ligation  2000  . Laparoscopy N/A 06/16/2013    Procedure: LAPAROSCOPY DIAGNOSTIC;  Surgeon: Lyman Speller, MD;  Location: [facility];  Service: Gynecology;  Laterality: N/A;   Family History  Problem Relation Age of Onset  . Other Other   . Diabetes Father   . Diabetes Maternal Uncle   . Hypertension Maternal Uncle   . Diabetes Paternal Aunt   . Hypertension Paternal Aunt   . Diabetes Maternal Grandmother   . Heart disease Maternal Aunt     CABG  . Heart disease Maternal Grandmother     CVA   History  Substance Use Topics  . Smoking status: Former Research scientist (life sciences)  . Smokeless tobacco: Not on file  . Alcohol Use: Yes     Comment: socially   OB History   Grav Para Term Preterm Abortions TAB SAB Ect Mult Living   4 2 2  2 2    2      Review of Systems  All other systems reviewed and are negative.    Allergies  Betadine and Lisinopril  Home Medications   Current Outpatient Rx  Name  Route  Sig  Dispense  Refill  . atenolol (TENORMIN) 50 MG tablet   Oral   Take 50 mg by mouth daily.         . hydrochlorothiazide (HYDRODIURIL)  25 MG tablet   Oral   Take 12.5 mg by mouth daily.         Marland Kitchen losartan (COZAAR) 50 MG tablet   Oral   Take 50 mg by mouth daily.         Marland Kitchen acetaminophen (TYLENOL) 500 MG tablet   Oral   Take 500 mg by mouth every 6 (six) hours as needed. For pain         . CALCIUM PO   Oral   Take 1,000 mg by mouth as needed.         . fluconazole (DIFLUCAN) 150 MG tablet   Oral   Take 1 tablet (150 mg total) by mouth once. Take one tablet.  Repeat in 48 hours if symptoms are not completely resolved.   2 tablet   1   . metroNIDAZOLE (METROGEL) 0.75 % vaginal gel      one applicator qhs for five days   70 g   0   . Multiple Vitamin (MULTIVITAMIN WITH MINERALS) TABS tablet   Oral   Take 1 tablet by mouth daily.         . naproxen sodium (ANAPROX) 220 MG  tablet   Oral   Take 2 tablets (440 mg total) by mouth 2 (two) times daily.   60 tablet   1   . OVER THE COUNTER MEDICATION      1 tablet as needed (Slim- Weight loss herbal).         . Probiotic Product (PROBIOTIC DAILY PO)   Oral   Take by mouth.         . sulfamethoxazole-trimethoprim (BACTRIM DS,SEPTRA DS) 800-160 MG per tablet   Oral   Take 1 tablet by mouth 2 (two) times daily.   20 tablet   0    BP 137/91  Pulse 65  Temp(Src) 98.9 F (37.2 C) (Oral)  Resp 16  SpO2 99%  LMP 10/24/2013 Physical Exam  Nursing note and vitals reviewed. Constitutional: She is oriented to person, place, and time. She appears well-developed and well-nourished. No distress.  HENT:  Head: Normocephalic and atraumatic.  Pulmonary/Chest: Effort normal. She exhibits tenderness.  Approximate 2x3cm abscess under left breast along chest wall. Fluctuance and tender to palpation. No pus noted.   Neurological: She is alert and oriented to person, place, and time.  Skin: Skin is warm and dry. No rash noted. She is not diaphoretic.     Psychiatric: Her behavior is normal.    ED Course  Irrigation and debridement Date/Time:  10/25/2013 1:38 PM Performed by: Bjorn Pippin Authorized by: Ihor Gully D Consent: Verbal consent obtained. Risks and benefits: risks, benefits and alternatives were discussed Consent given by: patient Patient understanding: patient states understanding of the procedure being performed Patient consent: the patient's understanding of the procedure matches consent given Patient identity confirmed: verbally with patient and arm band Time out: Immediately prior to procedure a "time out" was called to verify the correct patient, procedure, equipment, support staff and site/side marked as required. Preparation: Patient was prepped and draped in the usual sterile fashion. Local anesthesia used: yes Local anesthetic: lidocaine 2% without epinephrine Patient sedated: no Patient tolerance: Patient tolerated the procedure well with no immediate complications. Comments: After sterile prep and drape, and local anesthesia a 15 blade use to open abscess and drain, site then cleaned and packed with sterile 1/4" iodoform. Dressing applied.    (including critical care time) Labs Review Labs Reviewed - No data to display Imaging Review No results found.    MDM   1. Chest wall abscess   I&D, packed, abx. F/u in 2 days.     Bjorn Pippin, PA-C 10/25/13 1340

## 2013-10-25 NOTE — ED Notes (Signed)
Pt reports she has a abscess under her left breast x 5 days. Is painful and tender with movement. Pt has been using hot compress and antibiotic cream with no relief. Pt denies fever. Pt is alert and oriented and in no acute distress.

## 2013-10-25 NOTE — ED Notes (Signed)
Patient requested Rx be called into pharmacy. Kayla Rojas

## 2013-10-26 NOTE — ED Provider Notes (Signed)
Medical screening examination/treatment/procedure(s) were performed by resident physician or non-physician practitioner and as supervising physician I was immediately available for consultation/collaboration.   Pauline Good MD.   Billy Fischer, MD 10/26/13 2053

## 2013-10-27 ENCOUNTER — Encounter (HOSPITAL_COMMUNITY): Payer: Self-pay | Admitting: Emergency Medicine

## 2013-10-27 ENCOUNTER — Emergency Department (INDEPENDENT_AMBULATORY_CARE_PROVIDER_SITE_OTHER)
Admission: EM | Admit: 2013-10-27 | Discharge: 2013-10-27 | Disposition: A | Discharge status: Home / Self Care | Payer: 59 | Source: Home / Self Care | Attending: Family Medicine | Admitting: Family Medicine

## 2013-10-27 DIAGNOSIS — Z5189 Encounter for other specified aftercare: Secondary | ICD-10-CM

## 2013-10-27 DIAGNOSIS — Z4800 Encounter for change or removal of nonsurgical wound dressing: Secondary | ICD-10-CM

## 2013-10-27 NOTE — ED Notes (Signed)
Follow up on abscess States packing is still there Is still taking antibiotics No complications States abscess does feel better

## 2013-10-27 NOTE — ED Provider Notes (Signed)
CSN: [id number]     Arrival date & time 10/27/13  0819 History   First MD Initiated Contact with Patient 10/27/13 0840     Chief Complaint  Patient presents with  . Follow-up   (Consider location/radiation/quality/duration/timing/severity/associated sxs/prior Treatment) Patient is a 48 y.o. female presenting with wound check. The history is provided by the patient.  Wound Check This is a new problem. The current episode started 2 days ago (had I+D on 2/1, taking abx.). The problem has been gradually improving.    Past Medical History  Diagnosis Date  . Hypertension   . History of abnormal Pap smear   . Vulvar vestibulitis 2005  . Diabetes mellitus     controlled by diet  . 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 Induced abortion    . Cholestectomy    . Bunionectomy    . Cesarean section    . Tubal ligation  2000  . Laparoscopy N/A 06/16/2013    Procedure: LAPAROSCOPY DIAGNOSTIC;  Surgeon: Lyman Speller, MD;  Location: [facility];  Service: Gynecology;  Laterality: N/A;   Family History  Problem Relation Age of Onset  . Other Other   . Diabetes Father   . Diabetes Maternal Uncle   . Hypertension Maternal Uncle   . Diabetes Paternal Aunt   . Hypertension Paternal Aunt   . Diabetes Maternal Grandmother   . Heart disease Maternal Aunt     CABG  . Heart disease Maternal Grandmother     CVA   History  Substance Use Topics  . Smoking status: Former Research scientist (life sciences)  . Smokeless tobacco: Not on file  . Alcohol Use: Yes     Comment: socially   OB History   Grav Para Term Preterm Abortions TAB SAB Ect Mult Living   4 2 2  2 2    2      Review of Systems  Constitutional: Negative.   Skin: Negative for wound.    Allergies  Betadine and Lisinopril  Home Medications   Current Outpatient Rx  Name  Route  Sig  Dispense  Refill  . acetaminophen (TYLENOL) 500 MG tablet   Oral   Take 500 mg by  mouth every 6 (six) hours as needed. For pain         . atenolol (TENORMIN) 50 MG tablet   Oral   Take 50 mg by mouth daily.         Marland Kitchen CALCIUM PO   Oral   Take 1,000 mg by mouth as needed.         . fluconazole (DIFLUCAN) 150 MG tablet   Oral   Take 1 tablet (150 mg total) by mouth once. Take one tablet.  Repeat in 48 hours if symptoms are not completely resolved.   2 tablet   1   . hydrochlorothiazide (HYDRODIURIL) 25 MG tablet   Oral   Take 12.5 mg by mouth daily.         Marland Kitchen losartan (COZAAR) 50 MG tablet   Oral   Take 50 mg by mouth daily.         . metroNIDAZOLE (METROGEL) 0.75 % vaginal gel      one applicator qhs for five days   70 g   0   . Multiple Vitamin (MULTIVITAMIN WITH MINERALS) TABS tablet   Oral   Take 1 tablet by mouth daily.         Marland Kitchen  naproxen sodium (ANAPROX) 220 MG tablet   Oral   Take 2 tablets (440 mg total) by mouth 2 (two) times daily.   60 tablet   1   . OVER THE COUNTER MEDICATION      1 tablet as needed (Slim- Weight loss herbal).         . Probiotic Product (PROBIOTIC DAILY PO)   Oral   Take by mouth.         . sulfamethoxazole-trimethoprim (BACTRIM DS,SEPTRA DS) 800-160 MG per tablet   Oral   Take 1 tablet by mouth 2 (two) times daily.   20 tablet   0    BP 127/86  Pulse 68  Temp(Src) 98.1 F (36.7 C) (Oral)  Resp 16  SpO2 98%  LMP 10/24/2013 Physical Exam  Nursing note and vitals reviewed. Constitutional: She is oriented to person, place, and time. She appears well-developed and well-nourished.  Neurological: She is alert and oriented to person, place, and time.  Skin: Skin is warm and dry.  Packing removed, min drainage, nontender.    ED Course  Procedures (including critical care time) Labs Review Labs Reviewed - No data to display Imaging Review No results found.    MDM  Packing removed.    Billy Fischer, MD 10/27/13 [phone]

## 2013-10-27 NOTE — Discharge Instructions (Signed)
Warm compress twice a day when you take the antibiotic, take all of medicine, return as needed. °

## 2013-10-29 ENCOUNTER — Encounter: Payer: Self-pay | Admitting: Obstetrics & Gynecology

## 2013-10-30 ENCOUNTER — Other Ambulatory Visit: Payer: Self-pay

## 2013-10-30 DIAGNOSIS — Z1231 Encounter for screening mammogram for malignant neoplasm of breast: Secondary | ICD-10-CM

## 2013-10-30 LAB — CULTURE, ROUTINE-ABSCESS

## 2013-12-07 ENCOUNTER — Ambulatory Visit
Admission: RE | Admit: 2013-12-07 | Discharge: 2013-12-07 | Disposition: A | Discharge status: Home / Self Care | Payer: 59 | Source: Ambulatory Visit

## 2013-12-07 DIAGNOSIS — Z1231 Encounter for screening mammogram for malignant neoplasm of breast: Secondary | ICD-10-CM

## 2013-12-08 ENCOUNTER — Ambulatory Visit (INDEPENDENT_AMBULATORY_CARE_PROVIDER_SITE_OTHER): Payer: 59 | Admitting: Nurse Practitioner

## 2013-12-08 ENCOUNTER — Encounter: Payer: Self-pay | Admitting: Nurse Practitioner

## 2013-12-08 ENCOUNTER — Other Ambulatory Visit: Payer: Self-pay | Admitting: Family Medicine

## 2013-12-08 VITALS — BP 120/76 | HR 60 | Resp 16 | Ht 65.0 in | Wt 194.0 lb

## 2013-12-08 DIAGNOSIS — R928 Other abnormal and inconclusive findings on diagnostic imaging of breast: Secondary | ICD-10-CM

## 2013-12-08 DIAGNOSIS — B373 Candidiasis of vulva and vagina: Secondary | ICD-10-CM

## 2013-12-08 DIAGNOSIS — B3731 Acute candidiasis of vulva and vagina: Secondary | ICD-10-CM

## 2013-12-08 DIAGNOSIS — Z01419 Encounter for gynecological examination (general) (routine) without abnormal findings: Secondary | ICD-10-CM

## 2013-12-08 DIAGNOSIS — Z0000 Encounter for general adult medical examination without abnormal findings: Secondary | ICD-10-CM

## 2013-12-08 LAB — POCT URINALYSIS DIPSTICK
Bilirubin, UA: NEGATIVE
Blood, UA: NEGATIVE
GLUCOSE UA: NEGATIVE
KETONES UA: NEGATIVE
LEUKOCYTES UA: NEGATIVE
Nitrite, UA: NEGATIVE
Protein, UA: NEGATIVE
Urobilinogen, UA: NEGATIVE
pH, UA: 7

## 2013-12-08 LAB — HEMOGLOBIN, FINGERSTICK: Hemoglobin, fingerstick: 13.1 g/dL (ref 12.0–16.0)

## 2013-12-08 MED ORDER — FLUCONAZOLE 150 MG PO TABS: 150.0000 mg | ORAL_TABLET | Freq: Once | ORAL | Status: DC

## 2013-12-08 NOTE — Progress Notes (Signed)
Patient ID: Kayla Rojas, female   DOB: March 04, 1966, 48 y.o.   MRN: [id number] 48 y.o. [id number] Married African American Fe here for annual exam.  Menses last for 5 days. Flow is moderate to light. Some cramps 1-2 days. regular cycle. Some vaso symptoms. She had recent laparoscopy for adenomyosis and pelvic pain 06/16/13.  She currently has no pain or discomfort with her cycle other than usual tolerable symptoms. She has had a course of PCN for skin infection under left breast and for recent dental procedure. She now feels that she has a yeast infection.  Patient's last menstrual period was 11/17/2013.          Sexually active: yes  The current method of family planning is tubal ligation.    Exercising: no  The patient does not participate in regular exercise at present. Smoker:  no  Health Maintenance: Pap:  11/20/11, WNL, neg HR HPV MMG:  12/07/13, need additional images - patient aware TDaP:  11/2006? Labs:  HB:  13.1  Urine:  Negative    reports that she quit smoking about 4 years ago. Her smoking use included Cigarettes. She has a 7.75 pack-year smoking history. She has never used smokeless tobacco. She reports that she drinks alcohol. She reports that she does not use illicit drugs.  Past Medical History  Diagnosis Date  . Hypertension   . History of abnormal Pap smear 08/14/2001    ASCUS with Neg HR HPV  . Vulvar vestibulitis 2005  . Diabetes mellitus     controlled by diet  . 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 Induced abortion    . Bunionectomy Bilateral   . Tubal ligation  2000  . Laparoscopy N/A 06/16/2013    Procedure: LAPAROSCOPY DIAGNOSTIC;  Surgeon: Lyman Speller, MD;  Location: [facility];  Service: Gynecology;  Laterality: N/A;  . Cesarean section      X 2  . Cholecystectomy, laparoscopic  11/2009    Current Outpatient Prescriptions  Medication Sig Dispense Refill  .  acetaminophen (TYLENOL) 500 MG tablet Take 500 mg by mouth every 6 (six) hours as needed. For pain      . atenolol (TENORMIN) 50 MG tablet Take 50 mg by mouth daily.      Marland Kitchen CALCIUM PO Take 1,000 mg by mouth as needed.      . fluconazole (DIFLUCAN) 150 MG tablet Take 1 tablet (150 mg total) by mouth once. Take one tablet.  Repeat in 48 hours if symptoms are not completely resolved.  2 tablet  0  . hydrochlorothiazide (HYDRODIURIL) 25 MG tablet Take 12.5 mg by mouth daily.      Marland Kitchen losartan (COZAAR) 50 MG tablet Take 50 mg by mouth daily.      . Multiple Vitamin (MULTIVITAMIN WITH MINERALS) TABS tablet Take 1 tablet by mouth daily.      . naproxen sodium (ANAPROX) 220 MG tablet Take 440 mg by mouth daily as needed (menstrual cycle).      . Probiotic Product (PROBIOTIC DAILY PO) Take by mouth.       No current facility-administered medications for this visit.    Family History  Problem Relation Age of Onset  . Other Other   . Diabetes Father   . Cirrhosis Father   . Diabetes Maternal Uncle   . Hypertension Maternal Uncle   . Diabetes Paternal Aunt   .  Hypertension Paternal Aunt   . Diabetes Maternal Grandmother   . Heart disease Maternal Grandmother     CVA  . Heart disease Maternal Aunt     CABG  . Peripheral vascular disease Mother   . Multiple sclerosis Sister     ROS:  Pertinent items are noted in HPI.  Otherwise, a comprehensive ROS was negative.  Exam:   BP 120/76  Pulse 60  Resp 16  Ht 5\' 5"  (1.651 m)  Wt 194 lb (87.998 kg)  BMI 32.28 kg/m2  LMP 11/17/2013 Height: 5\' 5"  (165.1 cm)  Ht Readings from Last 3 Encounters:  12/08/13 5\' 5"  (1.651 m)  06/29/13 5' 4.5" (1.638 m)  06/01/13 5' 4.5" (1.638 m)    General appearance: alert, cooperative and appears stated age Head: Normocephalic, without obvious abnormality, atraumatic Neck: no adenopathy, supple, symmetrical, trachea midline and thyroid normal to inspection and palpation Lungs: clear to auscultation  bilaterally Breasts: normal appearance, no masses or tenderness, an area of healing sebacious cyst under right breast Heart: regular rate and rhythm Abdomen: soft, non-tender; no masses,  no organomegaly Extremities: extremities normal, atraumatic, no cyanosis or edema Skin: Skin color, texture, turgor normal. No rashes or lesions Lymph nodes: Cervical, supraclavicular, and axillary nodes normal. No abnormal inguinal nodes palpated Neurologic: Grossly normal   Pelvic: External genitalia:  no lesions              Urethra:  normal appearing urethra with no masses, tenderness or lesions              Bartholin's and Skene's: normal                 Vagina: normal appearing vagina with normal color and thick white discharge consistent with yeast, no lesions              Cervix: anteverted              Pap taken: no Bimanual Exam:  Uterus:  normal size, contour, position, consistency, mobility, non-tender              Adnexa: no mass, fullness, tenderness               Rectovaginal: Confirms               Anus:  normal sphincter tone, no lesions  A:  Well Woman with normal exam  Yeast vaginitis following course of PCN  S/P Pelvic laparoscopy for adenomyosis 05/2013  History of vulvar vestibulitis  HTN, DM  Remote history of abnormal pap - ASCUS 2004  P:   Pap smear as per guidelines  Diflucan X 2 for yeast vaginitis   Mammogram due for repeat from yesterday - will get scheduled  Counseled on breast self exam, mammography screening, adequate intake of calcium and vitamin D, diet and exercise return annually or prn  An After Visit Summary was printed and given to the patient.

## 2013-12-08 NOTE — Progress Notes (Signed)
Encounter reviewed by Dr. Yandiel Bergum Silva.  

## 2013-12-08 NOTE — Patient Instructions (Signed)

## 2013-12-21 ENCOUNTER — Ambulatory Visit
Admission: RE | Admit: 2013-12-21 | Discharge: 2013-12-21 | Disposition: A | Discharge status: Home / Self Care | Payer: 59 | Source: Ambulatory Visit | Attending: Family Medicine | Admitting: Family Medicine

## 2013-12-21 DIAGNOSIS — R928 Other abnormal and inconclusive findings on diagnostic imaging of breast: Secondary | ICD-10-CM

## 2014-04-05 ENCOUNTER — Ambulatory Visit (INDEPENDENT_AMBULATORY_CARE_PROVIDER_SITE_OTHER): Payer: 59 | Admitting: Nurse Practitioner

## 2014-04-05 ENCOUNTER — Encounter: Payer: Self-pay | Admitting: Nurse Practitioner

## 2014-04-05 VITALS — BP 134/82 | HR 72 | Ht 64.5 in | Wt 192.0 lb

## 2014-04-05 DIAGNOSIS — B9689 Other specified bacterial agents as the cause of diseases classified elsewhere: Secondary | ICD-10-CM

## 2014-04-05 DIAGNOSIS — A499 Bacterial infection, unspecified: Secondary | ICD-10-CM

## 2014-04-05 DIAGNOSIS — N760 Acute vaginitis: Secondary | ICD-10-CM

## 2014-04-05 MED ORDER — FLUCONAZOLE 150 MG PO TABS: 150.0000 mg | ORAL_TABLET | Freq: Once | ORAL | Status: DC

## 2014-04-05 MED ORDER — METRONIDAZOLE 500 MG PO TABS: 500.0000 mg | ORAL_TABLET | Freq: Two times a day (BID) | ORAL | Status: DC

## 2014-04-05 NOTE — Progress Notes (Signed)
Note reviewed, agree with plan.  Joan Herschberger, MD  

## 2014-04-05 NOTE — Progress Notes (Signed)
48 y.o.Married [place] American female [id number] with a 4 day(s) history of the following:burning, discharge described as clear, local irritation and odor Sexually active: Yes.   Last sexual activity:3days ago. Pt also reports the following associated symptoms: none.    Patient has not tried over the counter treatments.  She has stopped her Probiotics about a month ago after running out of them.  She plans to restart.  She has chronic history of BV.  LMP 03/24/14 and was normal.    Exam:  DTH:YHOOIL                Vag:wet prep done                Cx:  normal appearance                Uterus:normal size                Adnexa: no mass, fullness, tenderness  Wet Prep shows:KOH: negative, PH: 5.5; NSS: + clue cells   Dx: bacterial vaginosis - acute and chronic  Currently off probiotiucs   TX: Symptomatic local care discussed. Oral antibiotics see orders.  Flagyl 500 mg BID for a week  Follow with Diflucan 150 mg if needed  Will restart probiotics  If symotms not better to CB

## 2014-04-05 NOTE — Patient Instructions (Signed)
Bacterial Vaginosis Bacterial vaginosis is a vaginal infection that occurs when the normal balance of bacteria in the vagina is disrupted. It results from an overgrowth of certain bacteria. This is the most common vaginal infection in women of childbearing age. Treatment is important to prevent complications, especially in pregnant women, as it can cause a premature delivery. CAUSES  Bacterial vaginosis is caused by an increase in harmful bacteria that are normally present in smaller amounts in the vagina. Several different kinds of bacteria can cause bacterial vaginosis. However, the reason that the condition develops is not fully understood. RISK FACTORS Certain activities or behaviors can put you at an increased risk of developing bacterial vaginosis, including:  Having a new sex partner or multiple sex partners.  Douching.  Using an intrauterine device (IUD) for contraception. Women do not get bacterial vaginosis from toilet seats, bedding, swimming pools, or contact with objects around them. SIGNS AND SYMPTOMS  Some women with bacterial vaginosis have no signs or symptoms. Common symptoms include:  Grey vaginal discharge.  A fishlike odor with discharge, especially after sexual intercourse.  Itching or burning of the vagina and vulva.  Burning or pain with urination. DIAGNOSIS  Your health care provider will take a medical history and examine the vagina for signs of bacterial vaginosis. A sample of vaginal fluid may be taken. Your health care provider will look at this sample under a microscope to check for bacteria and abnormal cells. A vaginal pH test may also be done.  TREATMENT  Bacterial vaginosis may be treated with antibiotic medicines. These may be given in the form of a pill or a vaginal cream. A second round of antibiotics may be prescribed if the condition comes back after treatment.  HOME CARE INSTRUCTIONS   Only take over-the-counter or prescription medicines as  directed by your health care provider.  If antibiotic medicine was prescribed, take it as directed. Make sure you finish it even if you start to feel better.  Do not have sex until treatment is completed.  Tell all sexual partners that you have a vaginal infection. They should see their health care provider and be treated if they have problems, such as a mild rash or itching.  Practice safe sex by using condoms and only having one sex partner. SEEK MEDICAL CARE IF:   Your symptoms are not improving after 3 days of treatment.  You have increased discharge or pain.  You have a fever. MAKE SURE YOU:   Understand these instructions.  Will watch your condition.  Will get help right away if you are not doing well or get worse. FOR MORE INFORMATION  Centers for Disease Control and Prevention, Division of STD Prevention: www.cdc.gov/std American Sexual Health Association (ASHA): www.ashastd.org  Document Released: 09/10/2005 Document Revised: 07/01/2013 Document Reviewed: 04/22/2013 [employer] Patient Information 2015 [employer]. This information is not intended to replace advice given to you by your health care provider. Make sure you discuss any questions you have with your health care provider.  

## 2014-07-15 ENCOUNTER — Telehealth: Payer: Self-pay | Admitting: Nurse Practitioner

## 2014-07-15 NOTE — Telephone Encounter (Signed)
Spoke with patient. Advised spoke with Gay Filler, RN Nurse Manager and we can get patient in Monday 10/26 at 1:30pm with Dr.Miller. Advised will be able to speak with Dr.Miller about recurrence and treatment. Patient is agreeable to appointment date and time and seeing Dr.Miller.  Routing to provider for final review. Patient agreeable to disposition. Will close encounter

## 2014-07-15 NOTE — Telephone Encounter (Addendum)
Spoke with patient. Patient states that she has "recurrent BV" and was hoping to have a medication called in for her. Advised patient will need to see her for evaluation to make sure we are properly treating. Patient is agreeable. States that symptoms began 2 weeks ago. "Honestly, I feel like it never goes away. I feel like it is always kind of there." Patient would like to see if there is something that she may be able to do to prevent this from recurring. Patient has a half day tomorrow at work and would like to come in to be seen. Offered patient tomorrow at 12pm with Dr.Lathrop but patient declines. Offered tomorrow at 3pm with Dr.Silva but patient declines. Appointment scheduled for Monday at 10/26 at 3:30pm with Milford Cage, [place]. Patient is agreeable to date and time. Patient would like to know "Is there some type of medication regimen or different things I could do?" Advised patient will have to speak with provider regarding this at appointment. Patient is agreeable.

## 2014-07-15 NOTE — Telephone Encounter (Signed)
Patient calling stating she has "recurrent BV" and wants to speak with the nurse prior to scheduling an appointment if necessary.

## 2014-07-19 ENCOUNTER — Ambulatory Visit: Payer: 59 | Admitting: Nurse Practitioner

## 2014-07-19 ENCOUNTER — Telehealth: Payer: Self-pay | Admitting: Obstetrics & Gynecology

## 2014-07-19 ENCOUNTER — Ambulatory Visit: Payer: 59 | Admitting: Obstetrics & Gynecology

## 2014-07-19 NOTE — Telephone Encounter (Signed)
Patient called and paid her balance due and wanted to get back on Dr. Ammie Ferrier schedule today but the slot has been filled. Patient requested to speak with nurse prior to scheduling with a nurse practitioner.

## 2014-07-19 NOTE — Telephone Encounter (Signed)
Spoke with patient. She would like to see a MD related to ongoing BV. Would like to obtain more information about how to manage going forward or start a regimen for ongoing treatment. It is really bothering patient.  Patient scheduled for office visit with Dr. Charlies Constable tomorrow at [facility]. Patient agreeable.  Routing to provider for final review. Patient agreeable to disposition. Will close encounter

## 2014-07-19 NOTE — Telephone Encounter (Signed)
Patient left a message on answering machine 07/18/14 @7 :59am canceling appointment today "recurrent bv".

## 2014-07-20 ENCOUNTER — Encounter: Payer: Self-pay | Admitting: Gynecology

## 2014-07-20 ENCOUNTER — Ambulatory Visit (INDEPENDENT_AMBULATORY_CARE_PROVIDER_SITE_OTHER): Payer: 59 | Admitting: Gynecology

## 2014-07-20 VITALS — BP 120/74 | HR 78 | Resp 12 | Ht 64.5 in | Wt 190.0 lb

## 2014-07-20 DIAGNOSIS — N898 Other specified noninflammatory disorders of vagina: Secondary | ICD-10-CM

## 2014-07-20 LAB — POCT WET PREP (WET MOUNT): CLUE CELLS WET PREP WHIFF POC: POSITIVE

## 2014-07-20 NOTE — Progress Notes (Signed)
Subjective:     Patient ID: Kayla Rojas, female   DOB: [birth year], 48 y.o.   MRN: [id number]  HPI Comments: Pt here reporting vaginal discharge with odor.  Pt had menses 10/10, reports odor afterwards but now "calmed down".  Symptoms worse after sex and before cycle.  intermittent condom use, better with condoms but finds them irritating.  Husband is not circumcised.  Pt has had recurrent BV and yeast.  Pt has recently changed to dial antibacterial soap.  No douching.   Diabetes well controlled with diet. Taking probiotics. Never treated with boric acid Uses tampons and pads    Review of Systems Per HPI    Objective:   Physical Exam  Nursing note and vitals reviewed. Constitutional: She is oriented to person, place, and time. She appears well-developed and well-nourished.  Genitourinary: Uterus normal. Uterus is not tender. Cervix exhibits no motion tenderness. Right adnexum displays no tenderness. Left adnexum displays no tenderness. No tenderness around the vagina. Vaginal discharge (thick, white, no malodor) found.  Lymphadenopathy:       Right: No inguinal adenopathy present.       Left: No inguinal adenopathy present.  Neurological: She is alert and oriented to person, place, and time.   WP done     Assessment:     Normal wet prep except for pH     Plan:     Discussed findings with pt Suggest converting vaginal pH with either otc Luvena or boric acid  cetaphil externally Pt opts to start with otc

## 2014-07-20 NOTE — Patient Instructions (Signed)
cetaphil liquid for vulvar care

## 2014-07-26 ENCOUNTER — Encounter: Payer: Self-pay | Admitting: Gynecology

## 2014-11-02 ENCOUNTER — Other Ambulatory Visit: Payer: Self-pay

## 2014-11-02 DIAGNOSIS — Z1231 Encounter for screening mammogram for malignant neoplasm of breast: Secondary | ICD-10-CM

## 2014-12-10 ENCOUNTER — Ambulatory Visit
Admission: RE | Admit: 2014-12-10 | Discharge: 2014-12-10 | Disposition: A | Discharge status: Home / Self Care | Payer: 59 | Source: Ambulatory Visit

## 2014-12-10 ENCOUNTER — Ambulatory Visit: Payer: Self-pay

## 2014-12-10 DIAGNOSIS — Z1231 Encounter for screening mammogram for malignant neoplasm of breast: Secondary | ICD-10-CM

## 2014-12-13 ENCOUNTER — Ambulatory Visit (INDEPENDENT_AMBULATORY_CARE_PROVIDER_SITE_OTHER): Payer: 59 | Admitting: Nurse Practitioner

## 2014-12-13 ENCOUNTER — Encounter: Payer: Self-pay | Admitting: Nurse Practitioner

## 2014-12-13 VITALS — BP 136/82 | HR 68 | Ht 65.0 in | Wt 191.0 lb

## 2014-12-13 DIAGNOSIS — Z01419 Encounter for gynecological examination (general) (routine) without abnormal findings: Secondary | ICD-10-CM

## 2014-12-13 DIAGNOSIS — Z0000 Encounter for general adult medical examination without abnormal findings: Secondary | ICD-10-CM | POA: Diagnosis not present

## 2014-12-13 LAB — POCT URINALYSIS DIPSTICK
Bilirubin, UA: NEGATIVE
Glucose, UA: NEGATIVE
Ketones, UA: NEGATIVE
Leukocytes, UA: NEGATIVE
NITRITE UA: NEGATIVE
Protein, UA: NEGATIVE
RBC UA: NEGATIVE
UROBILINOGEN UA: NEGATIVE
pH, UA: 5

## 2014-12-13 MED ORDER — FLUCONAZOLE 150 MG PO TABS: 150.0000 mg | ORAL_TABLET | Freq: Once | ORAL | Status: DC

## 2014-12-13 NOTE — Patient Instructions (Signed)

## 2014-12-13 NOTE — Progress Notes (Signed)
Patient ID: Kayla Rojas, female   DOB: Apr 05, 1966, 49 y.o.   MRN: [id number] 49 y.o. [id number] Married  African American Fe here for annual exam.  Going to [place] on Wednesday to care for mother.  She has a history of clots and now requires IVC filter.   Menses is normal lasting 5 days.  She has history of chronic yeast - doing better with use of Cetaphil soap and Rephresh.  Patient's last menstrual period was 12/04/2014 (exact date).          Sexually active: Yes.    The current method of family planning is tubal ligation.    Exercising: No.  The patient does not participate in regular exercise at present. Smoker:  no  Health Maintenance: Pap:  11/20/11, negative with neg HR HPV MMG:  12/10/14, Bi-Rads 1:  Negative` TDaP:  11/2006? Labs:  HB:  Wellness at work and Dr. Stephanie Acre has results that were negative    reports that she quit smoking about 5 years ago. Her smoking use included Cigarettes. She has a 7.75 pack-year smoking history. She has never used smokeless tobacco. She reports that she drinks alcohol. She reports that she does not use illicit drugs.  Past Medical History  Diagnosis Date  . Hypertension   . History of abnormal Pap smear 08/14/2001    ASCUS with Neg HR HPV  . Vulvar vestibulitis 2005  . Diabetes mellitus     controlled by diet  . 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 Induced abortion    . Bunionectomy Bilateral   . Tubal ligation  2000  . Laparoscopy N/A 06/16/2013    Procedure: LAPAROSCOPY DIAGNOSTIC;  Surgeon: Lyman Speller, MD;  Location: [facility];  Service: Gynecology;  Laterality: N/A;  . Cesarean section      X 2  . Cholecystectomy, laparoscopic  11/2009    Current Outpatient Prescriptions  Medication Sig Dispense Refill  . acetaminophen (TYLENOL) 500 MG tablet Take 500 mg by mouth every 6 (six) hours as needed. For pain    . atenolol (TENORMIN) 50 MG  tablet Take 50 mg by mouth daily.    Marland Kitchen CALCIUM PO Take 1,000 mg by mouth as needed.    . hydrochlorothiazide (HYDRODIURIL) 25 MG tablet Take 12.5 mg by mouth daily.    Marland Kitchen losartan (COZAAR) 50 MG tablet Take 50 mg by mouth daily.    . Multiple Vitamin (MULTIVITAMIN WITH MINERALS) TABS tablet Take 1 tablet by mouth daily.    . naproxen sodium (ANAPROX) 220 MG tablet Take 440 mg by mouth daily as needed (menstrual cycle).    . Probiotic Product (PROBIOTIC DAILY PO) Take by mouth.    . fluconazole (DIFLUCAN) 150 MG tablet Take 1 tablet (150 mg total) by mouth once. Take one tablet.  Repeat in 48 hours if symptoms are not completely resolved. 2 tablet 0   No current facility-administered medications for this visit.    Family History  Problem Relation Age of Onset  . Other Other   . Diabetes Father   . Cirrhosis Father   . Diabetes Maternal Uncle   . Hypertension Maternal Uncle   . Diabetes Paternal Aunt   . Hypertension Paternal Aunt   . Diabetes Maternal Grandmother   . Heart disease Maternal Grandmother     CVA  . Heart disease Maternal Aunt     CABG  .  Peripheral vascular disease Mother   . Multiple sclerosis Sister     ROS:  Pertinent items are noted in HPI.  Otherwise, a comprehensive ROS was negative.  Exam:   BP 136/82 mmHg  Pulse 68  Ht 5\' 5"  (1.651 m)  Wt 191 lb (86.637 kg)  BMI 31.78 kg/m2  LMP 12/04/2014 (Exact Date) Height: 5\' 5"  (165.1 cm) Ht Readings from Last 3 Encounters:  12/13/14 5\' 5"  (1.651 m)  07/20/14 5' 4.5" (1.638 m)  04/05/14 5' 4.5" (1.638 m)    General appearance: alert, cooperative and appears stated age Head: Normocephalic, without obvious abnormality, atraumatic Neck: no adenopathy, supple, symmetrical, trachea midline and thyroid normal to inspection and palpation Lungs: clear to auscultation bilaterally Breasts: normal appearance, no masses or tenderness Heart: regular rate and rhythm Abdomen: soft, non-tender; no masses,  no  organomegaly Extremities: extremities normal, atraumatic, no cyanosis or edema Skin: Skin color, texture, turgor normal. No rashes or lesions Lymph nodes: Cervical, supraclavicular, and axillary nodes normal. No abnormal inguinal nodes palpated Neurologic: Grossly normal   Pelvic: External genitalia:  no lesions              Urethra:  normal appearing urethra with no masses, tenderness or lesions              Bartholin's and Skene's: normal                 Vagina: normal appearing vagina with normal color and discharge, no lesions              Cervix: anteverted              Pap taken: Yes.   Bimanual Exam:  Uterus:  normal size, contour, position, consistency, mobility, non-tender              Adnexa: no mass, fullness, tenderness               Rectovaginal: Confirms               Anus:  normal sphincter tone, no lesions  Chaperone present:  yes   A:  Well Woman with normal exam  S/P Pelvic laparoscopy for adenomyosis 05/2013 History of vulvar vestibulitis HTN, DM - diet controlled Remote history of abnormal pap - ASCUS 2002  History of chronic yeast  P:   Reviewed health and wellness pertinent to exam  Pap smear taken today  Mammogram is due 3/17  Refill of Diflucan # 2 to have on hand  Counseled on breast self exam, mammography screening, adequate intake of calcium and vitamin D, diet and exercise return annually or prn  An After Visit Summary was printed and given to the patient.

## 2014-12-16 LAB — IPS PAP TEST WITH HPV

## 2014-12-16 NOTE — Progress Notes (Signed)
Encounter reviewed by Dr. Brook Silva.  

## 2015-03-25 ENCOUNTER — Other Ambulatory Visit: Payer: Self-pay | Admitting: Nurse Practitioner

## 2015-03-25 MED ORDER — FLUCONAZOLE 150 MG PO TABS: 150.0000 mg | ORAL_TABLET | Freq: Once | ORAL | Status: DC

## 2015-03-25 NOTE — Telephone Encounter (Signed)
Medication refill request: Diflucan 150  Mg  Last AEX:  12/13/14 with PG  Next AEX: 12/16/15 with PG  Last MMG (if hormonal medication request): n/a  Refill authorized: Please advise.

## 2015-03-25 NOTE — Telephone Encounter (Signed)
Patient is requesting a refill of Diflucan. Last seen 12/13/14.

## 2015-03-29 NOTE — Telephone Encounter (Signed)
LM on patient's vm that rx has been sent to pharmacy.

## 2015-11-16 ENCOUNTER — Other Ambulatory Visit: Payer: Self-pay

## 2015-11-16 DIAGNOSIS — Z1231 Encounter for screening mammogram for malignant neoplasm of breast: Secondary | ICD-10-CM

## 2015-12-12 ENCOUNTER — Ambulatory Visit
Admission: RE | Admit: 2015-12-12 | Discharge: 2015-12-12 | Disposition: A | Discharge status: Home / Self Care | Payer: 59 | Source: Ambulatory Visit

## 2015-12-12 DIAGNOSIS — Z1231 Encounter for screening mammogram for malignant neoplasm of breast: Secondary | ICD-10-CM

## 2015-12-14 ENCOUNTER — Ambulatory Visit (INDEPENDENT_AMBULATORY_CARE_PROVIDER_SITE_OTHER): Payer: 59 | Admitting: Nurse Practitioner

## 2015-12-14 ENCOUNTER — Encounter: Payer: Self-pay | Admitting: Nurse Practitioner

## 2015-12-14 VITALS — BP 138/84 | HR 68 | Ht 65.0 in | Wt 184.0 lb

## 2015-12-14 DIAGNOSIS — Z113 Encounter for screening for infections with a predominantly sexual mode of transmission: Secondary | ICD-10-CM | POA: Diagnosis not present

## 2015-12-14 DIAGNOSIS — Z0000 Encounter for general adult medical examination without abnormal findings: Secondary | ICD-10-CM | POA: Diagnosis not present

## 2015-12-14 DIAGNOSIS — Z01419 Encounter for gynecological examination (general) (routine) without abnormal findings: Secondary | ICD-10-CM

## 2015-12-14 LAB — POCT URINALYSIS DIPSTICK
BILIRUBIN UA: NEGATIVE
Blood, UA: NEGATIVE
Glucose, UA: NEGATIVE
KETONES UA: NEGATIVE
LEUKOCYTES UA: NEGATIVE
Nitrite, UA: NEGATIVE
Protein, UA: NEGATIVE
Urobilinogen, UA: NEGATIVE
pH, UA: 6

## 2015-12-14 MED ORDER — FLUCONAZOLE 150 MG PO TABS: 150.0000 mg | ORAL_TABLET | Freq: Once | ORAL | Status: DC

## 2015-12-14 NOTE — Progress Notes (Signed)
Patient ID: Kayla Rojas, female   DOB: May 30, 1966, 50 y.o.   MRN: [id number] 50 y.o. [id number] Divorced  African American Fe here for annual exam.  Menses is regular lasting 5 days. Some cramps. Some vaso symptoms. No vaginal dryness. Divorce was final  last November.  New partner X 5 months. Her mother who lives in [place] is doing OK.  Her sister lives with the mother.   Patient's last menstrual period was 11/20/2015 (exact date).          Sexually active: Yes.    The current method of family planning is tubal ligation and condoms all of the time.    Exercising: No.  The patient does not participate in regular exercise at present. Smoker:  no  Health Maintenance: Pap:  12/13/14, Negative with neg HR HPV MMG: 12/12/15, Bi-Rads 1: Negative TDaP:  11/2006? HIV: 1999 with pregnancy Labs: PCP and wellness program at work  Urine: negative   reports that she quit smoking about 6 years ago. Her smoking use included Cigarettes. She has a 7.75 pack-year smoking history. She has never used smokeless tobacco. She reports that she drinks about 4.2 - 6.0 oz of alcohol per week. She reports that she does not use illicit drugs.  Past Medical History  Diagnosis Date  . Hypertension   . History of abnormal Pap smear 08/14/2001    ASCUS with Neg HR HPV  . Vulvar vestibulitis 2005  . Diabetes mellitus     controlled by diet  . 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 Induced abortion    . Bunionectomy Bilateral   . Tubal ligation  2000  . Laparoscopy N/A 06/16/2013    Procedure: LAPAROSCOPY DIAGNOSTIC;  Surgeon: Lyman Speller, MD;  Location: [facility];  Service: Gynecology;  Laterality: N/A;  . Cesarean section      X 2  . Cholecystectomy, laparoscopic  11/2009    Current Outpatient Prescriptions  Medication Sig Dispense Refill  . acetaminophen (TYLENOL) 500 MG tablet Take 500 mg by mouth every 6  (six) hours as needed. For pain    . atenolol (TENORMIN) 50 MG tablet Take 50 mg by mouth daily.    Marland Kitchen CALCIUM PO Take 1,000 mg by mouth as needed.    . hydrochlorothiazide (HYDRODIURIL) 25 MG tablet Take 12.5 mg by mouth daily.    Marland Kitchen losartan (COZAAR) 50 MG tablet Take 50 mg by mouth daily.    . Multiple Vitamin (MULTIVITAMIN WITH MINERALS) TABS tablet Take 1 tablet by mouth daily.    . naproxen sodium (ANAPROX) 220 MG tablet Take 440 mg by mouth daily as needed (menstrual cycle).    . Probiotic Product (PROBIOTIC DAILY PO) Take by mouth.    . fluconazole (DIFLUCAN) 150 MG tablet Take 1 tablet (150 mg total) by mouth once. Take one tablet.  Repeat in 48 hours if symptoms are not completely resolved. 2 tablet 0   No current facility-administered medications for this visit.    Family History  Problem Relation Age of Onset  . Other Other   . Diabetes Father   . Cirrhosis Father   . Diabetes Maternal Uncle   . Hypertension Maternal Uncle   . Diabetes Paternal Aunt   . Hypertension Paternal Aunt   . Diabetes Maternal Grandmother   . Heart disease Maternal Grandmother     CVA  . Heart disease Maternal  Aunt     CABG  . Peripheral vascular disease Mother   . Multiple sclerosis Sister     ROS:  Pertinent items are noted in HPI.  Otherwise, a comprehensive ROS was negative.  Exam:   BP 144/96 mmHg  Pulse 68  Ht 5\' 5"  (1.651 m)  Wt 184 lb (83.462 kg)  BMI 30.62 kg/m2  LMP 11/20/2015 (Exact Date) Height: 5\' 5"  (165.1 cm) Ht Readings from Last 3 Encounters:  12/14/15 5\' 5"  (1.651 m)  12/13/14 5\' 5"  (1.651 m)  07/20/14 5' 4.5" (1.638 m)    General appearance: alert, cooperative and appears stated age Head: Normocephalic, without obvious abnormality, atraumatic Neck: no adenopathy, supple, symmetrical, trachea midline and thyroid normal to inspection and palpation Lungs: clear to auscultation bilaterally Breasts: normal appearance, no masses or tenderness Heart: regular rate and  rhythm Abdomen: soft, non-tender; no masses,  no organomegaly Extremities: extremities normal, atraumatic, no cyanosis or edema Skin: Skin color, texture, turgor normal. No rashes or lesions Lymph nodes: Cervical, supraclavicular, and axillary nodes normal. No abnormal inguinal nodes palpated Neurologic: Grossly normal   Pelvic: External genitalia:  no lesions              Urethra:  normal appearing urethra with no masses, tenderness or lesions              Bartholin's and Skene's: normal                 Vagina: normal appearing vagina with normal color and white discharge, no lesions              Cervix: anteverted              Pap taken: No. Bimanual Exam:  Uterus:  normal size, contour, position, consistency, mobility, non-tender              Adnexa: no mass, fullness, tenderness               Rectovaginal: Confirms               Anus:  normal sphincter tone, no lesions  Chaperone present: yes  A:  Well Woman with normal exam  S/P Pelvic laparoscopy for adenomyosis 05/2013 History of vulvar vestibulitis HTN, DM - diet controlled Remote history of abnormal pap - ASCUS 2002 History of chronic yeast with current symptoms  R/O STD's   P:   Reviewed health and wellness pertinent to exam  Pap smear as above  Mammogram is due 11/2016  Follow up with labs and Affirm  Due to symptoms-  did refill her Diflucan  Counseled on breast self exam, mammography screening, adequate intake of calcium and vitamin D, diet and exercise return annually or prn  An After Visit Summary was printed and given to the patient.

## 2015-12-14 NOTE — Patient Instructions (Signed)

## 2015-12-15 ENCOUNTER — Other Ambulatory Visit: Payer: Self-pay | Admitting: Nurse Practitioner

## 2015-12-15 LAB — WET PREP BY MOLECULAR PROBE
Candida species: NEGATIVE
Gardnerella vaginalis: POSITIVE — AB
Trichomonas vaginosis: NEGATIVE

## 2015-12-15 LAB — STD PANEL
HIV: NONREACTIVE
Hepatitis B Surface Ag: NEGATIVE

## 2015-12-15 MED ORDER — METRONIDAZOLE 0.75 % VA GEL: 1.0000 | Freq: Every day | VAGINAL | Status: DC

## 2015-12-15 NOTE — Progress Notes (Signed)
Encounter reviewed by Dr. Brook Amundson C. Silva.  

## 2015-12-16 ENCOUNTER — Ambulatory Visit: Payer: 59 | Admitting: Nurse Practitioner

## 2015-12-16 LAB — IPS N GONORRHOEA AND CHLAMYDIA BY PCR

## 2016-07-05 ENCOUNTER — Ambulatory Visit (INDEPENDENT_AMBULATORY_CARE_PROVIDER_SITE_OTHER): Payer: 59 | Admitting: Obstetrics and Gynecology

## 2016-07-05 ENCOUNTER — Encounter: Payer: Self-pay | Admitting: Obstetrics and Gynecology

## 2016-07-05 ENCOUNTER — Telehealth: Payer: Self-pay | Admitting: Obstetrics and Gynecology

## 2016-07-05 VITALS — BP 138/86 | HR 76 | Resp 13 | Wt 184.0 lb

## 2016-07-05 DIAGNOSIS — Z113 Encounter for screening for infections with a predominantly sexual mode of transmission: Secondary | ICD-10-CM

## 2016-07-05 DIAGNOSIS — N760 Acute vaginitis: Secondary | ICD-10-CM | POA: Diagnosis not present

## 2016-07-05 DIAGNOSIS — B9689 Other specified bacterial agents as the cause of diseases classified elsewhere: Secondary | ICD-10-CM | POA: Diagnosis not present

## 2016-07-05 LAB — HEPATITIS C ANTIBODY: HCV AB: NEGATIVE

## 2016-07-05 MED ORDER — METRONIDAZOLE 500 MG PO TABS: 500.0000 mg | ORAL_TABLET | Freq: Two times a day (BID) | ORAL | 0 refills | Status: DC

## 2016-07-05 NOTE — Telephone Encounter (Signed)
Spoke with patient. Patient was diagnosed with BV today while in the office for STD testing. Patient is being treated with Flagyl 500 mg bid x 7 days. Patient is asking if she needs to notify her partner so that he can receive treatment. Advised patient BV can occur from bacterial changes with intercourse. Advised her partner does not need to be treated for BV. Advised while she is taking Flagyl medication she needs to abstain from intercourse. Will need to use condoms with intercourse moving forward. Reports her partner is awaiting STD testing results, but was started on an antibiotic and given an injection at the doctor. Advised until STD testing results as received should abstain from intercourse. Patient is agreeable. Advised if her partners testing returns positive she will need to notify the office so that she may be treated. Patient is agreeable. Advised she will be contacted to discuss results when they return. Patient is agreeable.  Routing to provider for final review. Patient agreeable to disposition. Will close encounter.

## 2016-07-05 NOTE — Telephone Encounter (Signed)
Patient has some questions regarding her visit today. She wants to know if she needs to notify anyone else and would like to speak to the nurse today.

## 2016-07-05 NOTE — Progress Notes (Signed)
GYNECOLOGY  VISIT   HPI: 50 y.o.   Divorced  [place] American  female   [phone] with Patient's last menstrual period was 06/18/2016.   here for  STD testing. The patient's partner is having some urethral d/c. He is being tested for STD's, she is here for the same. She is without symptoms, other than a slightly increased d/c after intercourse, slight fishy odor.  GYNECOLOGIC HISTORY: Patient's last menstrual period was 06/18/2016. Contraception:Tubal Ligation  Menopausal hormone therapy: none         OB History    Gravida Para Term Preterm AB Living   4 2 2  0 2 2   SAB TAB Ectopic Multiple Live Births   0 2 0 0 2         There are no active problems to display for this patient.   Past Medical History:  Diagnosis Date  . Diabetes mellitus    controlled by diet  . History of abnormal Pap smear 08/14/2001   ASCUS with Neg HR HPV  . Hypertension   . 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 Vulvar vestibulitis 2005    Past Surgical History:  Procedure Laterality Date  . BUNIONECTOMY Bilateral   . CESAREAN SECTION     X 2  . CHOLECYSTECTOMY, LAPAROSCOPIC  11/2009  . INDUCED ABORTION    . LAPAROSCOPY N/A 06/16/2013   Procedure: LAPAROSCOPY DIAGNOSTIC;  Surgeon: Lyman Speller, MD;  Location: [facility];  Service: Gynecology;  Laterality: N/A;  . TUBAL LIGATION  2000    Current Outpatient Prescriptions  Medication Sig Dispense Refill  . acetaminophen (TYLENOL) 500 MG tablet Take 500 mg by mouth every 6 (six) hours as needed. For pain    . atenolol (TENORMIN) 50 MG tablet Take 50 mg by mouth daily.    Marland Kitchen CALCIUM PO Take 1,000 mg by mouth as needed.    . hydrochlorothiazide (HYDRODIURIL) 25 MG tablet Take 12.5 mg by mouth daily.    Marland Kitchen losartan (COZAAR) 50 MG tablet Take 50 mg by mouth daily.    . Multiple Vitamin (MULTIVITAMIN WITH MINERALS) TABS tablet Take 1 tablet by mouth daily.    . naproxen sodium (ANAPROX) 220 MG tablet Take 440 mg by  mouth daily as needed (menstrual cycle).    . Probiotic Product (PROBIOTIC DAILY PO) Take by mouth.     No current facility-administered medications for this visit.      ALLERGIES: Betadine [povidone iodine] and Lisinopril  Family History  Problem Relation Age of Onset  . Other Other   . Diabetes Father   . Cirrhosis Father   . Diabetes Maternal Uncle   . Hypertension Maternal Uncle   . Diabetes Paternal Aunt   . Hypertension Paternal Aunt   . Diabetes Maternal Grandmother   . Heart disease Maternal Grandmother     CVA  . Heart disease Maternal Aunt     CABG  . Peripheral vascular disease Mother   . Multiple sclerosis Sister     Social History   Social History  . Marital status: Divorced    Spouse name: N/A  . Number of children: N/A  . Years of education: N/A   Occupational History  . Not on file.   Social History Main Topics  . Smoking status: Former Smoker    Packs/day: 0.25    Years: 31.00    Types: Cigarettes    Quit date: 09/24/2009  . Smokeless tobacco: Never Used  .  Alcohol use 4.2 - 6.0 oz/week    7 - 10 Standard drinks or equivalent per week     Comment: socially  . Drug use: No  . Sexual activity: Yes    Partners: Male    Birth control/ protection: Condom, Surgical     Comment: BTL   Other Topics Concern  . Not on file   Social History Narrative  . No narrative on file    Review of Systems  Constitutional: Negative.   HENT: Negative.   Eyes: Negative.   Respiratory: Negative.   Cardiovascular: Negative.   Gastrointestinal: Negative.   Genitourinary:       Vaginal discharge  Musculoskeletal: Negative.   Skin: Negative.   Neurological: Negative.   Endo/Heme/Allergies: Negative.   Psychiatric/Behavioral: Negative.     PHYSICAL EXAMINATION:    BP 138/86 (BP Location: Right Arm, Patient Position: Sitting, Cuff Size: Normal)   Pulse 76   Resp 13   Wt 184 lb (83.5 kg)   LMP 06/18/2016   BMI 30.62 kg/m     General appearance:  alert, cooperative and appears stated age  Pelvic: External genitalia:  no lesions              Urethra:  normal appearing urethra with no masses, tenderness or lesions              Bartholins and Skenes: normal                 Vagina: normal appearing vagina with an increase in thick white discharge, no lesions              Cervix: no lesions              Bimanual Exam:  Uterus:  normal size, contour, position, consistency, mobility, non-tender              Adnexa: no mass, fullness, tenderness                Chaperone was present for exam.  Wet prep: ++ clue, no trich, no wbc KOH: no yeast PH: 5   ASSESSMENT STD testing Bacterial vaginitis    PLAN STD testing Treat BV If partner is + she will let us know and I will treat her Condoms encouraged    An After Visit Summary was printed and given to the patient.

## 2016-07-05 NOTE — Patient Instructions (Signed)

## 2016-07-06 LAB — STD PANEL
HEP B S AG: NEGATIVE
HIV 1&2 Ab, 4th Generation: NONREACTIVE

## 2016-07-06 LAB — GC/CHLAMYDIA PROBE AMP
CT PROBE, AMP APTIMA: NOT DETECTED
GC PROBE AMP APTIMA: NOT DETECTED

## 2016-07-10 ENCOUNTER — Telehealth: Payer: Self-pay

## 2016-07-10 MED ORDER — FLUCONAZOLE 150 MG PO TABS: ORAL_TABLET | ORAL | 0 refills | Status: DC

## 2016-07-10 NOTE — Telephone Encounter (Signed)
Spoke with patient. Advised of message as seen below from [place]. Patient is agreeable. Rx for Diflucan 150 mg take 1 tablet po now repeat in 72 hours if symptoms persist #2 0RF sent to pharmacy on file.  Routing to provider for final review. Patient agreeable to disposition. Will close encounter.

## 2016-07-10 NOTE — Telephone Encounter (Signed)
-----   Message from Salvadore Dom, MD sent at 07/10/2016 11:12 AM EDT ----- Please call in diflucan 150 mg po x 1 now, may repeat x 1 in 72 hours. If she isn't feeling better with the diflucan, she needs to be seen.

## 2016-07-10 NOTE — Telephone Encounter (Signed)
Left message to call Kaitlyn at [phone]. 

## 2016-08-23 ENCOUNTER — Telehealth: Payer: Self-pay | Admitting: Nurse Practitioner

## 2016-08-23 NOTE — Telephone Encounter (Signed)
Patient said that she gets recurring yeast infections and would like Diflucan called to her pharmacy on file.

## 2016-08-23 NOTE — Telephone Encounter (Signed)
Spoke with patient. Patient states she has chronic yeast infections and is requesting prescription for diflucan. Patient reports symptoms started at the first part of the week with vaginal itching and white milky discharge. Patient denies any urinary complaints, fever, pain. Last AEX 12/14/15.Advised patient will review with Kem Boroughs, NP and return call with recommendations. Patient is agreeable.   Kem Boroughs, NP -please advise?

## 2016-08-24 MED ORDER — FLUCONAZOLE 150 MG PO TABS: ORAL_TABLET | ORAL | 0 refills | Status: DC

## 2016-08-24 NOTE — Telephone Encounter (Signed)
OK for a refill on Diflucan as she does have a chronic history of  Yeast.

## 2016-08-24 NOTE — Telephone Encounter (Signed)
Spoke with patient, advised order placed for diflucan at [facility] on file. Patient verbalizes understanding and is agreeable.  Routing to provider for final review. Patient is agreeable to disposition. Will close encounter.

## 2016-11-08 ENCOUNTER — Other Ambulatory Visit: Payer: Self-pay | Admitting: Family Medicine

## 2016-11-08 DIAGNOSIS — Z1231 Encounter for screening mammogram for malignant neoplasm of breast: Secondary | ICD-10-CM

## 2016-12-12 ENCOUNTER — Ambulatory Visit
Admission: RE | Admit: 2016-12-12 | Discharge: 2016-12-12 | Disposition: A | Discharge status: Home / Self Care | Payer: 59 | Source: Ambulatory Visit | Attending: Family Medicine | Admitting: Family Medicine

## 2016-12-12 DIAGNOSIS — Z1231 Encounter for screening mammogram for malignant neoplasm of breast: Secondary | ICD-10-CM

## 2016-12-17 ENCOUNTER — Encounter: Payer: Self-pay | Admitting: Nurse Practitioner

## 2016-12-17 ENCOUNTER — Ambulatory Visit (INDEPENDENT_AMBULATORY_CARE_PROVIDER_SITE_OTHER): Payer: 59 | Admitting: Nurse Practitioner

## 2016-12-17 VITALS — BP 138/80 | HR 80 | Resp 16 | Ht 64.75 in | Wt 188.0 lb

## 2016-12-17 DIAGNOSIS — Z0000 Encounter for general adult medical examination without abnormal findings: Secondary | ICD-10-CM

## 2016-12-17 DIAGNOSIS — Z23 Encounter for immunization: Secondary | ICD-10-CM

## 2016-12-17 DIAGNOSIS — N760 Acute vaginitis: Secondary | ICD-10-CM | POA: Diagnosis not present

## 2016-12-17 DIAGNOSIS — Z01419 Encounter for gynecological examination (general) (routine) without abnormal findings: Secondary | ICD-10-CM | POA: Diagnosis not present

## 2016-12-17 NOTE — Patient Instructions (Signed)

## 2016-12-17 NOTE — Progress Notes (Signed)
51 y.o. [id number] Divorced  African American Fe here for annual exam.  Menses now at 7 days.  Heavy for 3.5 days.  Super tampon and back up pad and changing every 2-3 hours.  Some cramps and small clots.  Some vaginal discharge X 1 week.   Same partner.  Seen for BV and tested for STD's 07/05/16 and does not feel she needs a recheck.  However does have a vaginal discharge and wants Affirm done.  Patient's last menstrual period was 12/07/2016.          Sexually active: Yes.    The current method of family planning is tubal ligation.    Exercising: No.  The patient does not participate in regular exercise at present. Smoker:  no  Health Maintenance: Pap:  12/13/14, Negative with neg HR HPV          11/20/11, negative with neg HR HPV MMG:  12/12/16 BIRADS 1 negative/density c TDaP:  2008/ due today HIV: 1999 with pregnancy Labs: PCP and wellness program at work   reports that she quit smoking about 7 years ago. Her smoking use included Cigarettes. She has a 7.75 pack-year smoking history. She has never used smokeless tobacco. She reports that she drinks about 4.2 - 6.0 oz of alcohol per week . She reports that she does not use drugs.  Past Medical History:  Diagnosis Date  . Diabetes mellitus    controlled by diet  . History of abnormal Pap smear 08/14/2001   ASCUS with Neg HR HPV  . Hypertension   . 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 Vulvar vestibulitis 2005    Past Surgical History:  Procedure Laterality Date  . BUNIONECTOMY Bilateral   . CESAREAN SECTION     X 2  . CHOLECYSTECTOMY, LAPAROSCOPIC  11/2009  . INDUCED ABORTION    . LAPAROSCOPY N/A 06/16/2013   Procedure: LAPAROSCOPY DIAGNOSTIC;  Surgeon: Lyman Speller, MD;  Location: [facility];  Service: Gynecology;  Laterality: N/A;  . TUBAL LIGATION  2000    Current Outpatient Prescriptions  Medication Sig Dispense Refill  . atenolol (TENORMIN) 50 MG tablet Take 50 mg by mouth daily.    .  cholecalciferol (VITAMIN D) 1000 units tablet Take 1,000 Units by mouth daily.    . hydrochlorothiazide (HYDRODIURIL) 50 MG tablet Take 25 mg by mouth daily.     Marland Kitchen losartan (COZAAR) 50 MG tablet Take 50 mg by mouth daily.    . naproxen sodium (ANAPROX) 220 MG tablet Take 440 mg by mouth daily as needed (menstrual cycle).    . Probiotic Product (PROBIOTIC DAILY PO) Take by mouth.    Marland Kitchen CALCIUM PO Take 1,000 mg by mouth as needed.     No current facility-administered medications for this visit.     Family History  Problem Relation Age of Onset  . Diabetes Father   . Cirrhosis Father   . Peripheral vascular disease Mother   . Multiple sclerosis Sister   . Other Other   . Diabetes Maternal Uncle   . Hypertension Maternal Uncle   . Diabetes Paternal Aunt   . Hypertension Paternal Aunt   . Diabetes Maternal Grandmother   . Heart disease Maternal Grandmother     CVA  . Heart disease Maternal Aunt     CABG  . Breast cancer Neg Hx     ROS:  Pertinent items are noted in HPI.  Otherwise, a comprehensive ROS was  negative.  Exam:   BP 138/80 (BP Location: Right Arm, Patient Position: Sitting, Cuff Size: Normal)   Pulse 80   Resp 16   Ht 5' 4.75" (1.645 m)   Wt 188 lb (85.3 kg)   LMP 12/07/2016   BMI 31.53 kg/m  Height: 5' 4.75" (164.5 cm) Ht Readings from Last 3 Encounters:  12/17/16 5' 4.75" (1.645 m)  12/14/15 5\' 5"  (1.651 m)  12/13/14 5\' 5"  (1.651 m)    General appearance: alert, cooperative and appears stated age Head: Normocephalic, without obvious abnormality, atraumatic Neck: no adenopathy, supple, symmetrical, trachea midline and thyroid normal to inspection and palpation Lungs: clear to auscultation bilaterally Breasts: normal appearance, no masses or tenderness Heart: regular rate and rhythm Abdomen: soft, non-tender; no masses,  no organomegaly Extremities: extremities normal, atraumatic, no cyanosis or edema Skin: Skin color, texture, turgor normal. No rashes or  lesions Lymph nodes: Cervical, supraclavicular, and axillary nodes normal. No abnormal inguinal nodes palpated Neurologic: Grossly normal   Pelvic: External genitalia:  no lesions              Urethra:  normal appearing urethra with no masses, tenderness or lesions              Bartholin's and Skene's: normal                 Vagina: normal appearing vagina with normal color and discharge, no lesions              Cervix: anteverted              Pap taken: No. Bimanual Exam:  Uterus:  normal size, contour, position, consistency, mobility, non-tender              Adnexa: no mass, fullness, tenderness               Rectovaginal: Confirms               Anus:  normal sphincter tone, no lesions  Chaperone present: yes  A:  Well Woman with normal exam     S/P Pelvic laparoscopy for adenomyosis 05/2013 History of vulvar vestibulitis HTN, DM - diet controlled Remote history of abnormal pap - ASCUS 2002 History of vaginitis  Update immunization             P:   Reviewed health and wellness pertinent to exam  Pap smear not done  Mammogram is due 3/19  Follow with Affirm  TDaP given today  Counseled on breast self exam, mammography screening, STD prevention, adequate intake of calcium and vitamin D, diet and exercise, Kegel's exercises return annually or prn  An After Visit Summary was printed and given to the patient.

## 2016-12-17 NOTE — Progress Notes (Signed)
Encounter reviewed by Dr. Ghazal Pevey Amundson C. Silva.  

## 2016-12-18 ENCOUNTER — Telehealth: Payer: Self-pay | Admitting: Nurse Practitioner

## 2016-12-18 LAB — WET PREP BY MOLECULAR PROBE
CANDIDA SPECIES: NOT DETECTED
GARDNERELLA VAGINALIS: DETECTED — AB
TRICHOMONAS VAG: NOT DETECTED

## 2016-12-18 MED ORDER — METRONIDAZOLE 500 MG PO TABS: 500.0000 mg | ORAL_TABLET | Freq: Two times a day (BID) | ORAL | 0 refills | Status: DC

## 2016-12-18 NOTE — Addendum Note (Signed)
Addended by: Graylon Good on: 12/18/2016 01:39 PM   Modules accepted: Orders

## 2016-12-18 NOTE — Telephone Encounter (Signed)
Patient calling to give Mrs.Patty the name and dosages of her medications. Atenolol 50mg , Losartan 100mg  and HCTZ 25mg .

## 2016-12-18 NOTE — Telephone Encounter (Signed)
Patient returned your call.

## 2016-12-18 NOTE — Telephone Encounter (Signed)
-----   Message from Kem Boroughs, [place] sent at 12/18/2016  8:06 AM EDT ----- Please let pt know that vaginal wet prep was positive for BV - she may have either Metrogel or Flagyl.  Give her precautions for GI and ETOH SE.

## 2016-12-18 NOTE — Telephone Encounter (Signed)
I have attempted to contact this patient by phone with the following results: left message to return call to [place] at [phone] on answering machine (mobile per [facility]).  No personal information given. [phone] (Mobile)

## 2016-12-18 NOTE — Telephone Encounter (Signed)
Patient notified of AFFIRM result in result note.  Please see below for blood pressure medications.

## 2016-12-24 ENCOUNTER — Telehealth: Payer: Self-pay | Admitting: Nurse Practitioner

## 2016-12-24 MED ORDER — FLUCONAZOLE 150 MG PO TABS: 150.0000 mg | ORAL_TABLET | Freq: Every day | ORAL | 0 refills | Status: DC

## 2016-12-24 NOTE — Telephone Encounter (Signed)
Unable to reach patient at number provided [phone]. Need to advise patient that rx for Diflucan has been sent to her pharmacy on file.

## 2016-12-24 NOTE — Telephone Encounter (Signed)
Ok for Diflucan 150 mg and repeat x 1 prn as you have outlined.

## 2016-12-24 NOTE — Telephone Encounter (Signed)
Patient was seen last week and was supposed to get a prescription for Diflucan to follow up after taking Metrgel.  Confirmed pharmacy with patient.

## 2016-12-24 NOTE — Telephone Encounter (Signed)
Patient was treated for BV on 12/17/2016 with Metrogel. Patient is asking for rx for Diflucan to treat yeast infection symptoms following completion of Metrogel.  Dr.Silva, okay to send in rx for Diflucan 150 mg take 2 tablets po x 1 repeat in 72 hours if symptoms persist?

## 2016-12-26 ENCOUNTER — Ambulatory Visit: Payer: 59 | Admitting: Nurse Practitioner

## 2016-12-26 NOTE — Telephone Encounter (Signed)
Spoke with patient. Patient states that she was notified by [facility] her rx was ready for pick up. Patient has taken first tablet and is aware she may take the second after 72 hours if symptoms persist. Aware she will need to contact the office if symptoms persist after taking the second tablet.  Routing to provider for final review. Patient agreeable to disposition. Will close encounter.

## 2017-01-22 ENCOUNTER — Telehealth: Payer: Self-pay | Admitting: Nurse Practitioner

## 2017-01-22 NOTE — Telephone Encounter (Signed)
Patient needs another OV to assess.  She needs another Affirm and possible STD's.

## 2017-01-22 NOTE — Telephone Encounter (Signed)
Patient wants to speak with the nurse. She did not want to disclose any information.

## 2017-01-22 NOTE — Telephone Encounter (Signed)
Spoke with patient. Patient states she has completed oral flagyl for BV followed by diflucan. Patient reports completing flagyl on 4/14. Patient states symptoms never really went away and has now started cycle a week early with an awful vaginal odor. Patient states discharge was clear until cycle, denies pain and urinary complaints, reports burning. Patient states she has had intercourse since being treated. Advised patient would review with Kem Boroughs, NP for recommendations and return call, patient is agreeable.   Kem Boroughs, NP -please advise?

## 2017-01-22 NOTE — Telephone Encounter (Signed)
Spoke with patient, advised as seen below per Kem Boroughs, NP. Patient states she is currently in heaviest days of cycle, would like to schedule after cycle. Patient scheduled for 01/28/17 at 3pm. Patient is agreeable to date and time.   Routing to provider for final review. Patient is agreeable to disposition. Will close encounter.

## 2017-01-28 ENCOUNTER — Ambulatory Visit (INDEPENDENT_AMBULATORY_CARE_PROVIDER_SITE_OTHER): Payer: 59 | Admitting: Nurse Practitioner

## 2017-01-28 ENCOUNTER — Encounter: Payer: Self-pay | Admitting: Nurse Practitioner

## 2017-01-28 VITALS — BP 140/100 | HR 76 | Resp 16 | Ht 64.75 in | Wt 186.2 lb

## 2017-01-28 DIAGNOSIS — N760 Acute vaginitis: Secondary | ICD-10-CM | POA: Diagnosis not present

## 2017-01-28 NOTE — Progress Notes (Signed)
51 y.o. Divorced [place] American female [phone] here with complaint of vaginal symptoms of odor and burning. Noticed right before starting menses a week ago and now after. Denies itching and discharge.   Onset of symptoms 7 days ago. Denies new personal products or vaginal dryness. Unsure STD concerns partner may have ben unfaithful.. Urinary symptoms none.  Contraception is none.  She was rushing to get here for exam and feels this is cause of elevated BP.  History of BV and does well with Flagyl.   O:  Healthy female WDWN Affect: normal, orientation x 3  Exam: no distress Abdomen: soft and non tender Lymph node: no enlargement or tenderness Pelvic exam: External genital: normal female BUS: negative Vagina: white thin discharge noted.  Affirm taken. Cervix: normal, non tender, no CMT Uterus: normal, non tender Adnexa:normal, non tender, no masses or fullness noted    A: R/O Vaginitis  R/O STD's  Elevated BP on medication   P: Discussed findings of vaginitis and etiology. Discussed Aveeno or baking soda sitz bath for comfort. Avoid moist clothes or pads for extended period of time. If working out in gym clothes or swim suits for long periods of time change underwear or bottoms of swimsuit if possible. Olive Oil/Coconut Oil use for skin protection prior to activity can be used to external skin.  Rx: will await test results  Follow with Affirm and STD's  RV prn

## 2017-01-28 NOTE — Patient Instructions (Signed)
Bacterial Vaginosis Bacterial vaginosis is a vaginal infection that occurs when the normal balance of bacteria in the vagina is disrupted. It results from an overgrowth of certain bacteria. This is the most common vaginal infection among women ages 15-44. Because bacterial vaginosis increases your risk for STIs (sexually transmitted infections), getting treated can help reduce your risk for chlamydia, gonorrhea, herpes, and HIV (human immunodeficiency virus). Treatment is also important for preventing complications in pregnant women, because this condition can cause an early (premature) delivery. What are the causes? This condition is caused by an increase in harmful bacteria that are normally present in small amounts in the vagina. However, the reason that the condition develops is not fully understood. What increases the risk? The following factors may make you more likely to develop this condition:  Having a new sexual partner or multiple sexual partners.  Having unprotected sex.  Douching.  Having an intrauterine device (IUD).  Smoking.  Drug and alcohol abuse.  Taking certain antibiotic medicines.  Being pregnant.  You cannot get bacterial vaginosis from toilet seats, bedding, swimming pools, or contact with objects around you. What are the signs or symptoms? Symptoms of this condition include:  Grey or white vaginal discharge. The discharge can also be watery or foamy.  A fish-like odor with discharge, especially after sexual intercourse or during menstruation.  Itching in and around the vagina.  Burning or pain with urination.  Some women with bacterial vaginosis have no signs or symptoms. How is this diagnosed? This condition is diagnosed based on:  Your medical history.  A physical exam of the vagina.  Testing a sample of vaginal fluid under a microscope to look for a large amount of bad bacteria or abnormal cells. Your health care provider may use a cotton swab  or a small wooden spatula to collect the sample.  How is this treated? This condition is treated with antibiotics. These may be given as a pill, a vaginal cream, or a medicine that is put into the vagina (suppository). If the condition comes back after treatment, a second round of antibiotics may be needed. Follow these instructions at home: Medicines  Take over-the-counter and prescription medicines only as told by your health care provider.  Take or use your antibiotic as told by your health care provider. Do not stop taking or using the antibiotic even if you start to feel better. General instructions  If you have a female sexual partner, tell her that you have a vaginal infection. She should see her health care provider and be treated if she has symptoms. If you have a female sexual partner, he does not need treatment.  During treatment: ? Avoid sexual activity until you finish treatment. ? Do not douche. ? Avoid alcohol as directed by your health care provider. ? Avoid breastfeeding as directed by your health care provider.  Drink enough water and fluids to keep your urine clear or pale yellow.  Keep the area around your vagina and rectum clean. ? Wash the area daily with warm water. ? Wipe yourself from front to back after using the toilet.  Keep all follow-up visits as told by your health care provider. This is important. How is this prevented?  Do not douche.  Wash the outside of your vagina with warm water only.  Use protection when having sex. This includes latex condoms and dental dams.  Limit how many sexual partners you have. To help prevent bacterial vaginosis, it is best to have sex with just   one partner (monogamous).  Make sure you and your sexual partner are tested for STIs.  Wear cotton or cotton-lined underwear.  Avoid wearing tight pants and pantyhose, especially during summer.  Limit the amount of alcohol that you drink.  Do not use any products that  contain nicotine or tobacco, such as cigarettes and e-cigarettes. If you need help quitting, ask your health care provider.  Do not use illegal drugs. Where to find more information:  Centers for Disease Control and Prevention: www.cdc.gov/std  American Sexual Health Association (ASHA): www.ashastd.org  U.S. Department of Health and Human Services, Office on Women's Health: www.womenshealth.gov/ or https://www.womenshealth.gov/a-z-topics/bacterial-vaginosis Contact a health care provider if:  Your symptoms do not improve, even after treatment.  You have more discharge or pain when urinating.  You have a fever.  You have pain in your abdomen.  You have pain during sex.  You have vaginal bleeding between periods. Summary  Bacterial vaginosis is a vaginal infection that occurs when the normal balance of bacteria in the vagina is disrupted.  Because bacterial vaginosis increases your risk for STIs (sexually transmitted infections), getting treated can help reduce your risk for chlamydia, gonorrhea, herpes, and HIV (human immunodeficiency virus). Treatment is also important for preventing complications in pregnant women, because the condition can cause an early (premature) delivery.  This condition is treated with antibiotic medicines. These may be given as a pill, a vaginal cream, or a medicine that is put into the vagina (suppository). This information is not intended to replace advice given to you by your health care provider. Make sure you discuss any questions you have with your health care provider. Document Released: 09/10/2005 Document Revised: 05/26/2016 Document Reviewed: 05/26/2016 Elsevier Interactive Patient Education  2017 Elsevier Inc.  

## 2017-01-29 ENCOUNTER — Telehealth: Payer: Self-pay | Admitting: Nurse Practitioner

## 2017-01-29 LAB — STD PANEL
HIV: NONREACTIVE
Hepatitis B Surface Ag: NEGATIVE

## 2017-01-29 LAB — HEPATITIS C ANTIBODY: HCV Ab: NEGATIVE

## 2017-01-29 LAB — WET PREP BY MOLECULAR PROBE
CANDIDA SPECIES: NOT DETECTED
Gardnerella vaginalis: DETECTED — AB
TRICHOMONAS VAG: NOT DETECTED

## 2017-01-29 LAB — GC/CHLAMYDIA PROBE AMP
CT Probe RNA: NOT DETECTED
GC PROBE AMP APTIMA: NOT DETECTED

## 2017-01-29 NOTE — Progress Notes (Signed)
Encounter reviewed by Dr. Aundria Rud. BP 140/100 today.  Previous visit on no office on 12/17/16, BP 138/80. She has known HTN and is treated with Atenolol, HCTZ, and Losartan.

## 2017-01-29 NOTE — Telephone Encounter (Signed)
Patient calling for test results. °

## 2017-01-29 NOTE — Telephone Encounter (Signed)
Melvia Heaps CNM, okay to advise patient that Hep C, STD panel, GC/Chl were all negative. Wet prep showed negative yeast and trich, but positive for BV. Okay to treat with Flagyl or Metrogel?

## 2017-01-29 NOTE — Telephone Encounter (Signed)
Yes

## 2017-01-30 ENCOUNTER — Other Ambulatory Visit: Payer: Self-pay

## 2017-01-30 MED ORDER — FLUCONAZOLE 150 MG PO TABS: ORAL_TABLET | ORAL | 0 refills | Status: DC

## 2017-01-30 MED ORDER — METRONIDAZOLE 500 MG PO TABS: 500.0000 mg | ORAL_TABLET | Freq: Two times a day (BID) | ORAL | 0 refills | Status: DC

## 2017-01-30 NOTE — Telephone Encounter (Signed)
Spoke with patient. Advised of results as seen below. Patient is agreeable and verbalizes understanding. Patient would like to start on Flagyl at this time. Rx for Flagyl 500 mg BID x 7 days #14 0RF sent to pharmacy on file. Avoid alcohol during treatment and 24 hours after completing medication. Don't mix with alcohol if mixed can cause severe nausea, vomiting and abdominal cramping. Patient verbalizes understanding.  Cc: Kem Boroughs, FNP   Routing to provider for final review. Patient agreeable to disposition. Will close encounter.

## 2017-04-29 ENCOUNTER — Telehealth: Payer: Self-pay | Admitting: Obstetrics and Gynecology

## 2017-04-29 NOTE — Telephone Encounter (Signed)
Left patient a message to call back to reschedule a future appointment that was cancelled by the provider. °

## 2017-12-18 ENCOUNTER — Other Ambulatory Visit (HOSPITAL_COMMUNITY)
Admission: RE | Admit: 2017-12-18 | Discharge: 2017-12-18 | Disposition: A | Discharge status: Home / Self Care | Payer: 59 | Source: Ambulatory Visit | Attending: Obstetrics and Gynecology | Admitting: Obstetrics and Gynecology

## 2017-12-18 ENCOUNTER — Other Ambulatory Visit: Payer: Self-pay

## 2017-12-18 ENCOUNTER — Ambulatory Visit: Payer: 59 | Admitting: Nurse Practitioner

## 2017-12-18 ENCOUNTER — Ambulatory Visit (INDEPENDENT_AMBULATORY_CARE_PROVIDER_SITE_OTHER): Payer: 59 | Admitting: Obstetrics and Gynecology

## 2017-12-18 ENCOUNTER — Encounter: Payer: Self-pay | Admitting: Obstetrics and Gynecology

## 2017-12-18 VITALS — BP 124/80 | HR 80 | Resp 14 | Ht 64.25 in | Wt 182.0 lb

## 2017-12-18 DIAGNOSIS — N9089 Other specified noninflammatory disorders of vulva and perineum: Secondary | ICD-10-CM | POA: Diagnosis not present

## 2017-12-18 DIAGNOSIS — Z113 Encounter for screening for infections with a predominantly sexual mode of transmission: Secondary | ICD-10-CM | POA: Insufficient documentation

## 2017-12-18 DIAGNOSIS — Z01419 Encounter for gynecological examination (general) (routine) without abnormal findings: Secondary | ICD-10-CM | POA: Diagnosis not present

## 2017-12-18 DIAGNOSIS — Z124 Encounter for screening for malignant neoplasm of cervix: Secondary | ICD-10-CM | POA: Diagnosis present

## 2017-12-18 DIAGNOSIS — N760 Acute vaginitis: Secondary | ICD-10-CM

## 2017-12-18 DIAGNOSIS — Z1151 Encounter for screening for human papillomavirus (HPV): Secondary | ICD-10-CM | POA: Diagnosis not present

## 2017-12-18 DIAGNOSIS — K629 Disease of anus and rectum, unspecified: Secondary | ICD-10-CM | POA: Diagnosis not present

## 2017-12-18 NOTE — Progress Notes (Signed)
52 y.o. [id number] DivorcedAfrican AmericanF here for annual exam.   She thinks she has BV again. Symptoms started 2 weeks ago. She c/o itching, irritation. After intercourse she noticed a clumpy d/c. Slight odor. Primary gave her diflucan, she took one 2 weeks ago and one yesterday Period Cycle (Days): 28 Period Duration (Days): 5 days  Period Pattern: Regular Menstrual Flow: Moderate Menstrual Control: Tampon, Maxi pad Menstrual Control Change Freq (Hours): changes pad/ tampon every 3-4 hours  Dysmenorrhea: (!) Moderate Dysmenorrhea Symptoms: Cramping  She has DM, thinks her HgbA1C is between 7 and 8. She was taking the metformin 2 x a day, had to go back to one time a day secondary to hypoglycemia. Feeling better.  Sexually active, same partner x >1 year, no pain. She isn't sure if he has other partners.  She has some vulvar bumps, sometimes itch (thinks skin tags), get itchy. Also has a stretch mark near her anus that gets uncomfortable.   Patient's last menstrual period was 12/01/2017.          Sexually active: Yes.    The current method of family planning is tubal ligation.    Exercising: No.  The patient does not participate in regular exercise at present. Smoker:  Former smoker   Health Maintenance: Pap:  12-13-14 WNL NEG HR HPV  History of abnormal Pap:  no MMG:  12-12-16 WNL  Colonoscopy:  Never BMD:   Never TDaP:  12-17-16  Gardasil: N/A   reports that she quit smoking about 8 years ago. Her smoking use included cigarettes. She has a 7.75 pack-year smoking history. She has never used smokeless tobacco. She reports that she drinks about 3.6 oz of alcohol per week. She reports that she does not use drugs. She has been drinking about 6 24 oz bottles of beer. She is cutting back.  Son is currently at home, taking a break from school. Daughter is in [place]. [place], one in [place] and one in [place]. She works at [employer] in [employer].   Past Medical History:  Diagnosis Date  .  Diabetes mellitus    controlled by diet  . History of abnormal Pap smear 08/14/2001   ASCUS with Neg HR HPV  . Hypertension   . 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 Vulvar vestibulitis 2005    Past Surgical History:  Procedure Laterality Date  . BUNIONECTOMY Bilateral   . CESAREAN SECTION     X 2  . CHOLECYSTECTOMY, LAPAROSCOPIC  11/2009  . INDUCED ABORTION    . LAPAROSCOPY N/A 06/16/2013   Procedure: LAPAROSCOPY DIAGNOSTIC;  Surgeon: Lyman Speller, MD;  Location: [facility];  Service: Gynecology;  Laterality: N/A;  . TUBAL LIGATION  2000    Current Outpatient Medications  Medication Sig Dispense Refill  . atenolol (TENORMIN) 50 MG tablet Take 50 mg by mouth daily.    . hydrochlorothiazide (HYDRODIURIL) 25 MG tablet Take 25 mg by mouth daily.    Marland Kitchen losartan (COZAAR) 100 MG tablet Take 100 mg by mouth daily.    . naproxen sodium (ANAPROX) 220 MG tablet Take 440 mg by mouth daily as needed (menstrual cycle).    . Probiotic Product (PROBIOTIC DAILY PO) Take by mouth.    . metFORMIN (GLUCOPHAGE) 500 MG tablet 1 TABLET WITH A MEAL TWICE A DAY ORALLY 30 DAYS  2   No current facility-administered medications for this visit.     Family History  Problem Relation  Age of Onset  . Diabetes Father   . Cirrhosis Father   . Peripheral vascular disease Mother   . Multiple sclerosis Sister   . Other Other   . Diabetes Maternal Uncle   . Hypertension Maternal Uncle   . Diabetes Paternal Aunt   . Hypertension Paternal Aunt   . Diabetes Maternal Grandmother   . Heart disease Maternal Grandmother        CVA  . Heart disease Maternal Aunt        CABG  . Breast cancer Neg Hx     Review of Systems  Genitourinary:       Vaginal itching   All other systems reviewed and are negative.   Exam:   BP 124/80 (BP Location: Right Arm, Patient Position: Sitting, Cuff Size: Normal)   Pulse 80   Resp 14   Ht 5' 4.25" (1.632 m)   Wt 182 lb (82.6 kg)   LMP  12/01/2017   BMI 31.00 kg/m   Weight change: @WEIGHTCHANGE @ Height:   Height: 5' 4.25" (163.2 cm)  Ht Readings from Last 3 Encounters:  12/18/17 5' 4.25" (1.632 m)  01/28/17 5' 4.75" (1.645 m)  12/17/16 5' 4.75" (1.645 m)    General appearance: alert, cooperative and appears stated age Head: Normocephalic, without obvious abnormality, atraumatic Neck: no adenopathy, supple, symmetrical, trachea midline and thyroid normal to inspection and palpation Lungs: clear to auscultation bilaterally Cardiovascular: regular rate and rhythm Breasts: normal appearance, no masses or tenderness Abdomen: soft, non-tender; non distended,  no masses,  no organomegaly Extremities: extremities normal, atraumatic, no cyanosis or edema Skin: Skin color, texture, turgor normal. No rashes or lesions Lymph nodes: Cervical, supraclavicular, and axillary nodes normal. No abnormal inguinal nodes palpated Neurologic: Grossly normal   Pelvic: External genitalia:  She has a large, pedunculated skin tag on the left labia majora, she has several lesions in the posterior fourchette, unclear if skin tags or condyloma              Urethra:  normal appearing urethra with no masses, tenderness or lesions              Bartholins and Skenes: normal                 Vagina: normal appearing vagina with normal color. Slight increase in watery, white vaginal d/c              Cervix: no cervical motion tenderness and no lesions               Bimanual Exam:  Uterus:  normal size, contour, position, consistency, mobility, non-tender              Adnexa: no mass, fullness, tenderness               Rectovaginal: Confirms               Anus:  normal sphincter tone, perianal condyloma  Chaperone was present for exam.  A:  Well Woman with normal exam  Multiple vulvar lesions, get irritated, large one gets caught in her underwear. Skin tag and ? Condyloma  Perianal condyloma  Vulvovaginitis  P:   Pap with hpv, GC/CT  Screening  STD  Discussed breast self exam  Discussed calcium and vit D intake  Will return for removal of perianal and likely vulvar lesions as well  Affirm  Mammogram due  Labs with her primary

## 2017-12-19 ENCOUNTER — Other Ambulatory Visit: Payer: Self-pay | Admitting: Family Medicine

## 2017-12-19 ENCOUNTER — Telehealth: Payer: Self-pay | Admitting: *Deleted

## 2017-12-19 DIAGNOSIS — Z1231 Encounter for screening mammogram for malignant neoplasm of breast: Secondary | ICD-10-CM

## 2017-12-19 LAB — VAGINITIS/VAGINOSIS, DNA PROBE
CANDIDA SPECIES: NEGATIVE
Gardnerella vaginalis: POSITIVE — AB
Trichomonas vaginosis: NEGATIVE

## 2017-12-19 LAB — HEP, RPR, HIV PANEL
HIV Screen 4th Generation wRfx: NONREACTIVE
Hepatitis B Surface Ag: NEGATIVE
RPR: NONREACTIVE

## 2017-12-19 LAB — HEPATITIS C ANTIBODY: Hep C Virus Ab: <0.1 s/co ratio (ref 0.0–0.9)

## 2017-12-19 MED ORDER — METRONIDAZOLE 500 MG PO TABS: 500.0000 mg | ORAL_TABLET | Freq: Two times a day (BID) | ORAL | 0 refills | Status: DC

## 2017-12-19 MED ORDER — FLUCONAZOLE 150 MG PO TABS: 150.0000 mg | ORAL_TABLET | Freq: Once | ORAL | 0 refills | Status: AC

## 2017-12-19 NOTE — Telephone Encounter (Signed)
-----   Message from Salvadore Dom, MD sent at 12/19/2017  1:02 PM EDT ----- Please inform the patient that her vaginitis probe was + for BV and treat with flagyl (either oral or vaginal, her choice), no ETOH while on Flagyl.  Oral: Flagyl 500 mg BID x 7 days, or Vaginal: Metrogel, 1 applicator per vagina q day x 5 days. Her blood work was fine. Her pap and cervical cultures are pending.

## 2017-12-19 NOTE — Telephone Encounter (Signed)
Diflucan script sent. Please inform

## 2017-12-19 NOTE — Telephone Encounter (Signed)
Notes recorded by Burnice Logan, RN on 12/19/2017 at 1:22 PM EDT Left message to call Sharee Pimple at [phone].

## 2017-12-19 NOTE — Telephone Encounter (Signed)
Spoke with patient, advised as seen below per Dr. Talbert Nan. Rx for Flagyl po to verified pharmacy on file. ETOH precautions reviewed. Patient requesting Rx for Diflucan, states she also needs diflucan with flagyl. Advised will review with Dr. Talbert Nan and return call.   Dr. Talbert Nan, please advise on diflucan RX?

## 2017-12-20 ENCOUNTER — Ambulatory Visit
Admission: RE | Admit: 2017-12-20 | Discharge: 2017-12-20 | Disposition: A | Discharge status: Home / Self Care | Payer: 59 | Source: Ambulatory Visit | Attending: Family Medicine | Admitting: Family Medicine

## 2017-12-20 DIAGNOSIS — Z1231 Encounter for screening mammogram for malignant neoplasm of breast: Secondary | ICD-10-CM

## 2017-12-20 LAB — CYTOLOGY - PAP
Chlamydia: NEGATIVE
DIAGNOSIS: NEGATIVE
HPV (WINDOPATH): NOT DETECTED
NEISSERIA GONORRHEA: NEGATIVE

## 2017-12-25 NOTE — Telephone Encounter (Signed)
Spoke with patient, was notified of diflucan Rx by pharmacy, did pick up medication. Patient thankful for return call. Will close encounter.

## 2018-03-07 ENCOUNTER — Telehealth: Payer: Self-pay | Admitting: Obstetrics and Gynecology

## 2018-03-07 DIAGNOSIS — K629 Disease of anus and rectum, unspecified: Secondary | ICD-10-CM

## 2018-03-07 DIAGNOSIS — N9089 Other specified noninflammatory disorders of vulva and perineum: Secondary | ICD-10-CM

## 2018-03-07 NOTE — Telephone Encounter (Signed)
Patient has questions about her last visit.

## 2018-03-07 NOTE — Telephone Encounter (Signed)
Spoke with patient. Reports vaginal d/c with fishy odor and vaginal itching. Patient states she was seen in 11/2017 and discussed removal of skin tags, was unclear of follow up for removal. Patient states they become bother when skin rubs together, would like them removed.   Denies pain, irregular;ar bleeding, urinary complaints, N/V, fever/chills.   Recommended OV for further evaluation of symptoms, scheduled for 03/14/18 at 3:30pm with Dr. Talbert Nan. Patient declined earlier OV. Patient asking if skin tags can be removed same day?  Advised will review with Dr. Talbert Nan on 6/17 and return call. Patient agreeable.   Dr. Talbert Nan -ok to schedule for perianal/ vulvar skin tag/ lesions same day?

## 2018-03-10 NOTE — Telephone Encounter (Signed)
Yes, can you please place the order and send for pre certification.

## 2018-03-10 NOTE — Telephone Encounter (Signed)
Call returned to patient, agreeable with plan. 6/21 3:30pm appt changed to procedure. Order placed, advised patient she will be called prior to appt to review benefits. Patient agreeable.   Routing to [employer] for [employer]. Will close encounter.

## 2018-03-12 ENCOUNTER — Telehealth: Payer: Self-pay | Admitting: Obstetrics and Gynecology

## 2018-03-12 NOTE — Telephone Encounter (Signed)
Patient is asking if she could  come in for her original  appointment without having the procedure done due to her insurance coverage?

## 2018-03-13 NOTE — Telephone Encounter (Signed)
Spoke with patient, does not wish to proceed with procedure for skin tag/lesion removal at this time, will schedule at later date. Would still like to be seen for vaginal odor and d/c. Appointment updated to [place] on 6/21 at 3:30pm with Dr. Talbert Nan.  Routing to provider for final review. Patient is agreeable to disposition. Will close encounter.   Cc: Kayla Rojas

## 2018-03-14 ENCOUNTER — Ambulatory Visit (INDEPENDENT_AMBULATORY_CARE_PROVIDER_SITE_OTHER): Payer: 59 | Admitting: Obstetrics and Gynecology

## 2018-03-14 ENCOUNTER — Encounter: Payer: Self-pay | Admitting: Obstetrics and Gynecology

## 2018-03-14 ENCOUNTER — Other Ambulatory Visit: Payer: Self-pay

## 2018-03-14 VITALS — BP 110/68 | HR 83 | Resp 14 | Ht 64.0 in | Wt 180.2 lb

## 2018-03-14 DIAGNOSIS — B9689 Other specified bacterial agents as the cause of diseases classified elsewhere: Secondary | ICD-10-CM | POA: Diagnosis not present

## 2018-03-14 DIAGNOSIS — A630 Anogenital (venereal) warts: Secondary | ICD-10-CM

## 2018-03-14 DIAGNOSIS — N760 Acute vaginitis: Secondary | ICD-10-CM

## 2018-03-14 MED ORDER — IMIQUIMOD 5 % EX CREA: TOPICAL_CREAM | CUTANEOUS | 2 refills | Status: DC

## 2018-03-14 MED ORDER — FLUCONAZOLE 150 MG PO TABS: 150.0000 mg | ORAL_TABLET | Freq: Once | ORAL | 0 refills | Status: AC

## 2018-03-14 MED ORDER — METRONIDAZOLE 0.75 % VA GEL: 1.0000 | Freq: Every day | VAGINAL | 0 refills | Status: DC

## 2018-03-14 NOTE — Progress Notes (Signed)
GYNECOLOGY  VISIT   HPI: 52 y.o.   Divorced  [place] American  female   [phone] with Patient's last menstrual period was 03/13/2018 (exact date).   here for  Vaginal odor & slight itching. Symptoms started shortly after her last treatment. She c/o a watery d/c with odor. Symptoms are moderate, no change. No irritation.  No bladder symptoms.  No change in sexual partner, negative std testing in 3/19.   Patient was treated for BV in 3/19 (prior episode in 5/18)   GYNECOLOGIC HISTORY: Patient's last menstrual period was 03/13/2018 (exact date). Contraception:tubal ligation Menopausal hormone therapy: none        OB History    Gravida  4   Para  2   Term  2   Preterm  0   AB  2   Living  2     SAB  0   TAB  2   Ectopic  0   Multiple  0   Live Births  2              There are no active problems to display for this patient.   Past Medical History:  Diagnosis Date  . Diabetes mellitus    controlled by diet  . History of abnormal Pap smear 08/14/2001   ASCUS with Neg HR HPV  . Hypertension   . 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 Vulvar vestibulitis 2005    Past Surgical History:  Procedure Laterality Date  . BUNIONECTOMY Bilateral   . CESAREAN SECTION     X 2  . CHOLECYSTECTOMY, LAPAROSCOPIC  11/2009  . INDUCED ABORTION    . LAPAROSCOPY N/A 06/16/2013   Procedure: LAPAROSCOPY DIAGNOSTIC;  Surgeon: Lyman Speller, MD;  Location: [facility];  Service: Gynecology;  Laterality: N/A;  . TUBAL LIGATION  2000    Current Outpatient Medications  Medication Sig Dispense Refill  . atenolol (TENORMIN) 50 MG tablet Take 50 mg by mouth daily.    . indapamide (LOZOL) 1.25 MG tablet TAKE 1 TABLET BY MOUTH EVERY DAY IN THE MORNING  12  . losartan (COZAAR) 100 MG tablet Take 100 mg by mouth daily.    . metFORMIN (GLUCOPHAGE) 500 MG tablet 1 TABLET WITH A MEAL TWICE A DAY ORALLY 30 DAYS  2  . naproxen sodium (ANAPROX) 220 MG tablet  Take 440 mg by mouth daily as needed (menstrual cycle).    . Probiotic Product (PROBIOTIC DAILY PO) Take by mouth.     No current facility-administered medications for this visit.      ALLERGIES: Betadine [povidone iodine] and Lisinopril  Family History  Problem Relation Age of Onset  . Diabetes Father   . Cirrhosis Father   . Peripheral vascular disease Mother   . Multiple sclerosis Sister   . Other Other   . Diabetes Maternal Uncle   . Hypertension Maternal Uncle   . Diabetes Paternal Aunt   . Hypertension Paternal Aunt   . Breast cancer Paternal Aunt 66  . Diabetes Maternal Grandmother   . Heart disease Maternal Grandmother        CVA  . Heart disease Maternal Aunt        CABG    Social History   Socioeconomic History  . Marital status: Divorced    Spouse name: Not on file  . Number of children: Not on file  . Years of education: Not on file  . Highest education level:  Not on file  Occupational History  . Not on file  Social Needs  . Financial resource strain: Not on file  . Food insecurity:    Worry: Not on file    Inability: Not on file  . Transportation needs:    Medical: Not on file    Non-medical: Not on file  Tobacco Use  . Smoking status: Former Smoker    Packs/day: 0.25    Years: 31.00    Pack years: 7.75    Types: Cigarettes    Last attempt to quit: 09/24/2009    Years since quitting: 8.4  . Smokeless tobacco: Never Used  Substance and Sexual Activity  . Alcohol use: Yes    Alcohol/week: 3.6 oz    Types: 6 Standard drinks or equivalent per week  . Drug use: No  . Sexual activity: Yes    Partners: Male    Birth control/protection: Condom, Surgical    Comment: BTL  Lifestyle  . Physical activity:    Days per week: Not on file    Minutes per session: Not on file  . Stress: Not on file  Relationships  . Social connections:    Talks on phone: Not on file    Gets together: Not on file    Attends religious service: Not on file    Active  member of club or organization: Not on file    Attends meetings of clubs or organizations: Not on file    Relationship status: Not on file  . Intimate partner violence:    Fear of current or ex partner: Not on file    Emotionally abused: Not on file    Physically abused: Not on file    Forced sexual activity: Not on file  Other Topics Concern  . Not on file  Social History Narrative   Works with [employer] in [place]  Constitutional: Negative.   HENT: Negative.   Eyes: Negative.   Respiratory: Negative.   Cardiovascular: Negative.   Gastrointestinal: Negative.   Genitourinary: Negative.   Musculoskeletal: Negative.   Skin:       Vaginal odor & itching  Neurological: Negative.   Endo/Heme/Allergies: Negative.   Psychiatric/Behavioral: Negative.     PHYSICAL EXAMINATION:    BP 110/68 (BP Location: Right Arm, Patient Position: Sitting, Cuff Size: Normal)   Pulse 83   Resp 14   Ht 5\' 4"  (1.626 m)   Wt 180 lb 3.2 oz (81.7 kg)   LMP 03/13/2018 (Exact Date)   BMI 30.93 kg/m     General appearance: alert, cooperative and appears stated age   Pelvic: External genitalia:  Skin tag on left labia majora, several large lesions in the posterior fourchette, suspect condyloma              Urethra:  normal appearing urethra with no masses, tenderness or lesions              Bartholins and Skenes: normal                 Vagina: normal appearing vagina with normal color and discharge, no lesions, some blood, on menses.               Cervix: no lesions                Chaperone was present for exam.  Wet prep: ++ clue, ? 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  F/U in 3 months   An After Visit Summary was printed and given to the patient.  Over 5 minutes face to face time of which over 50% was spent in counseling.

## 2018-03-14 NOTE — Patient Instructions (Signed)
Genital Warts Genital warts are a common STD (sexually transmitted disease). They may appear as small bumps on the tissues of the genital area or anal area. Sometimes, they can become irritated and cause pain. Genital warts are easily passed to other people through sexual contact. Getting treatment is important because genital warts can lead to other problems. In females, the virus that causes genital warts may increase the risk of cervical cancer. What are the causes? Genital warts are caused by a virus that is called human papillomavirus (HPV). HPV is spread by having unprotected sex with an infected person. It can be spread through vaginal, anal, and oral sex. Many people do not know that they are infected. They may be infected for years without problems. However, even if they do not have problems, they can pass the infection to their sexual partners. What increases the risk? Genital warts are more likely to develop in:  People who have unprotected sex.  People who have multiple sexual partners.  People who become sexually active before they are 50 years of age.  Men who are not circumcised.  Women who have a female sexual partner who is not circumcised.  People who have a weakened body defense system (immune system) due to disease or medicine.  People who smoke.  What are the signs or symptoms? Symptoms of genital warts include:  Small growths in the genital area or anal area. These warts often grow in clusters.  Itching and irritation in the genital area or anal area.  Bleeding from the warts.  Painful sexual intercourse.  How is this diagnosed? Genital warts can usually be diagnosed from their appearance on the vagina, vulva, penis, perineum, anus, or rectum. Tests may also be done, such as:  Biopsy. A tissue sample is removed so it can be looked at under a microscope.  Colposcopy. In females, a magnifying tool is used to examine the vagina and cervix. Certain solutions may  be used to make the HPV cells change color so they can be seen more easily.  A Pap test in females.  Tests for other STDs.  How is this treated? Treatment for genital warts may include:  Applying prescription medicines to the warts. These may be solutions or creams.  Freezing the warts with liquid nitrogen (cryotherapy).  Burning the warts with: ? Laser treatment. ? An electrified probe (electrocautery).  Injecting a substance (Candida antigen or Trichophyton antigen) into the warts to help the body's immune system to fight off the warts.  Interferon injections.  Surgery to remove the warts.  Follow these instructions at home: Medicines  Apply over-the-counter and prescription medicines only as told by your health care provider.  Do not treat genital warts with medicines that are used for treating hand warts.  Talk with your health care provider about using over-the-counter anti-itch creams. General instructions  Do not touch or scratch the warts.  Do not have sex until your treatment has been completed.  Tell your current and past sexual partners about your condition because they may also need treatment.  Keep all follow-up visits as told by your health care provider. This is important.  After treatment, use condoms during sex to prevent future infections. Other Instructions for Women  Women who have genital warts might need increased screening for cervical cancer. This type of cancer is slow growing and can be cured if it is found early. Chances of developing cervical cancer are increased with HPV.  If you become pregnant, tell your health   care provider that you have had HPV. Your health care provider will monitor you closely during pregnancy to be sure that your baby is safe. How is this prevented? Talk with your health care provider about getting the HPV vaccines. These vaccines prevent some HPV infections and cancers. It is recommended that the vaccine be given to  males and females who are 65-9 years of age. It will not work if you already have HPV, and it is not recommended for pregnant women. Contact a health care provider if:  You have redness, swelling, or pain in the area of the treated skin.  You have a fever.  You feel generally ill.  You feel lumps in and around your genital area or anal area.  You have bleeding in your genital area or anal area.  You have pain during sexual intercourse. This information is not intended to replace advice given to you by your health care provider. Make sure you discuss any questions you have with your health care provider. Document Released: 09/07/2000 Document Revised: 02/16/2016 Document Reviewed: 12/06/2014 Elsevier Interactive Patient Education  2018 [employer]. Bacterial Vaginosis Bacterial vaginosis is a vaginal infection that occurs when the normal balance of bacteria in the vagina is disrupted. It results from an overgrowth of certain bacteria. This is the most common vaginal infection among women ages 19-44. Because bacterial vaginosis increases your risk for STIs (sexually transmitted infections), getting treated can help reduce your risk for chlamydia, gonorrhea, herpes, and HIV (human immunodeficiency virus). Treatment is also important for preventing complications in pregnant women, because this condition can cause an early (premature) delivery. What are the causes? This condition is caused by an increase in harmful bacteria that are normally present in small amounts in the vagina. However, the reason that the condition develops is not fully understood. What increases the risk? The following factors may make you more likely to develop this condition:  Having a new sexual partner or multiple sexual partners.  Having unprotected sex.  Douching.  Having an intrauterine device (IUD).  Smoking.  Drug and alcohol abuse.  Taking certain antibiotic medicines.  Being pregnant.  You cannot  get bacterial vaginosis from toilet seats, bedding, swimming pools, or contact with objects around you. What are the signs or symptoms? Symptoms of this condition include:  Grey or white vaginal discharge. The discharge can also be watery or foamy.  A fish-like odor with discharge, especially after sexual intercourse or during menstruation.  Itching in and around the vagina.  Burning or pain with urination.  Some women with bacterial vaginosis have no signs or symptoms. How is this diagnosed? This condition is diagnosed based on:  Your medical history.  A physical exam of the vagina.  Testing a sample of vaginal fluid under a microscope to look for a large amount of bad bacteria or abnormal cells. Your health care provider may use a cotton swab or a small wooden spatula to collect the sample.  How is this treated? This condition is treated with antibiotics. These may be given as a pill, a vaginal cream, or a medicine that is put into the vagina (suppository). If the condition comes back after treatment, a second round of antibiotics may be needed. Follow these instructions at home: Medicines  Take over-the-counter and prescription medicines only as told by your health care provider.  Take or use your antibiotic as told by your health care provider. Do not stop taking or using the antibiotic even if you start to feel  better. General instructions  If you have a female sexual partner, tell her that you have a vaginal infection. She should see her health care provider and be treated if she has symptoms. If you have a female sexual partner, he does not need treatment.  During treatment: ? Avoid sexual activity until you finish treatment. ? Do not douche. ? Avoid alcohol as directed by your health care provider. ? Avoid breastfeeding as directed by your health care provider.  Drink enough water and fluids to keep your urine clear or pale yellow.  Keep the area around your vagina and  rectum clean. ? Wash the area daily with warm water. ? Wipe yourself from front to back after using the toilet.  Keep all follow-up visits as told by your health care provider. This is important. How is this prevented?  Do not douche.  Wash the outside of your vagina with warm water only.  Use protection when having sex. This includes latex condoms and dental dams.  Limit how many sexual partners you have. To help prevent bacterial vaginosis, it is best to have sex with just one partner (monogamous).  Make sure you and your sexual partner are tested for STIs.  Wear cotton or cotton-lined underwear.  Avoid wearing tight pants and pantyhose, especially during summer.  Limit the amount of alcohol that you drink.  Do not use any products that contain nicotine or tobacco, such as cigarettes and e-cigarettes. If you need help quitting, ask your health care provider.  Do not use illegal drugs. Where to find more information:  Centers for Disease Control and Prevention: AppraiserFraud.fi  American Sexual Health Association (ASHA): www.ashastd.org  U.S. Department of Health and Financial controller, Office on Women's Health: DustingSprays.pl or SecuritiesCard.it Contact a health care provider if:  Your symptoms do not improve, even after treatment.  You have more discharge or pain when urinating.  You have a fever.  You have pain in your abdomen.  You have pain during sex.  You have vaginal bleeding between periods. Summary  Bacterial vaginosis is a vaginal infection that occurs when the normal balance of bacteria in the vagina is disrupted.  Because bacterial vaginosis increases your risk for STIs (sexually transmitted infections), getting treated can help reduce your risk for chlamydia, gonorrhea, herpes, and HIV (human immunodeficiency virus). Treatment is also important for preventing complications in pregnant women, because the  condition can cause an early (premature) delivery.  This condition is treated with antibiotic medicines. These may be given as a pill, a vaginal cream, or a medicine that is put into the vagina (suppository). This information is not intended to replace advice given to you by your health care provider. Make sure you discuss any questions you have with your health care provider. Document Released: 09/10/2005 Document Revised: 01/14/2017 Document Reviewed: 05/26/2016 Elsevier Interactive Patient Education  [employer].

## 2018-03-15 LAB — VAGINITIS/VAGINOSIS, DNA PROBE
Candida Species: NEGATIVE
GARDNERELLA VAGINALIS: NEGATIVE
TRICHOMONAS VAG: NEGATIVE

## 2018-06-03 ENCOUNTER — Telehealth: Payer: Self-pay | Admitting: Obstetrics and Gynecology

## 2018-06-03 NOTE — Telephone Encounter (Signed)
Patient is calling with bacterial vaginosis symptoms. Patient declined an appointment today stating she is available on Friday 06/06/18. Dr.Jertson is not in the office on Friday.

## 2018-06-03 NOTE — Telephone Encounter (Signed)
Spoke with patient. Patient reports thin, watery vaginal d/c with fishy odor. Has had intercourse x1 since being treated for BV 03/15/18, noticed the odor with intercourse. Denies pelvic pain, bleeding, fever/chills. Requesting RX. Advised would need to be seen in office. Patient states she can only schedule on Fridays. Patient states she is scheduled for a 3 mo f/u on 9/24 for possible condyloma, never picked up Aldara Rx from pharmacy. Patient request to combine appointments. Patient is aware Dr. Talbert Nan is not in the office on Fridays, request any provider.   OV scheduled for 9/13 at 3:45pm with Dr. Quincy Simmonds.   Routing to provider for final review. Patient is agreeable to disposition. Will close encounter.  Cc: Dr. Quincy Simmonds

## 2018-06-05 DIAGNOSIS — I10 Essential (primary) hypertension: Secondary | ICD-10-CM | POA: Insufficient documentation

## 2018-06-05 NOTE — Progress Notes (Signed)
GYNECOLOGY  VISIT   HPI: 52 y.o.   Divorced  [place] American  female   [phone] with No LMP recorded.   here for vaginal irritation.    Notes odor as well.  Having discharge as well.   May be interested in boric acid.  States she always gets yeast infection after the treatment BV.  No new partners.   Has had BV for years per patient.   Treated for BV 3/19 and 6/19.  Used Flagyl in March and then Metrogel in June.   Did not pick up the Rx for Aldara.  States the medication was not available (on back order).  Has concerns about developing a long term plan for vaginitis as she is losing her job and her insurance.   GYNECOLOGIC HISTORY: No LMP recorded. Contraception: tubal ligation   Menopausal hormone therapy:  metrogel Last mammogram:  12/20/2017 BI-RADS CATEGORY  1: Negative. Last pap smear:   12/18/2017 normal        OB History    Gravida  4   Para  2   Term  2   Preterm  0   AB  2   Living  2     SAB  0   TAB  2   Ectopic  0   Multiple  0   Live Births  2              Patient Active Problem List   Diagnosis Date Noted  . Hypertension 06/05/2018    Past Medical History:  Diagnosis Date  . Diabetes mellitus    controlled by diet  . History of abnormal Pap smear 08/14/2001   ASCUS with Neg HR HPV  . Hypertension   . 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 Vulvar vestibulitis 2005    Past Surgical History:  Procedure Laterality Date  . BUNIONECTOMY Bilateral   . CESAREAN SECTION     X 2  . CHOLECYSTECTOMY, LAPAROSCOPIC  11/2009  . INDUCED ABORTION    . LAPAROSCOPY N/A 06/16/2013   Procedure: LAPAROSCOPY DIAGNOSTIC;  Surgeon: Lyman Speller, MD;  Location: [facility];  Service: Gynecology;  Laterality: N/A;  . TUBAL LIGATION  2000    Current Outpatient Medications  Medication Sig Dispense Refill  . atenolol (TENORMIN) 50 MG tablet Take 50 mg by mouth daily.    . indapamide (LOZOL) 1.25 MG tablet  TAKE 1 TABLET BY MOUTH EVERY DAY IN THE MORNING  12  . losartan (COZAAR) 100 MG tablet Take 100 mg by mouth daily.    . metFORMIN (GLUCOPHAGE) 500 MG tablet 1 TABLET WITH A MEAL TWICE A DAY ORALLY 30 DAYS  2  . metroNIDAZOLE (METROGEL) 0.75 % vaginal gel Place 1 Applicatorful vaginally at bedtime. Use for 5 nights 70 g 0  . naproxen sodium (ANAPROX) 220 MG tablet Take 440 mg by mouth daily as needed (menstrual cycle).    . Probiotic Product (PROBIOTIC DAILY PO) Take by mouth.     No current facility-administered medications for this visit.      ALLERGIES: Betadine [povidone iodine] and Lisinopril  Family History  Problem Relation Age of Onset  . Diabetes Father   . Cirrhosis Father   . Peripheral vascular disease Mother   . Multiple sclerosis Sister   . Other Other   . Diabetes Maternal Uncle   . Hypertension Maternal Uncle   . Diabetes Paternal Aunt   . Hypertension Paternal Aunt   .  Breast cancer Paternal Aunt 60  . Diabetes Maternal Grandmother   . Heart disease Maternal Grandmother        CVA  . Heart disease Maternal Aunt        CABG    Social History   Socioeconomic History  . Marital status: Divorced    Spouse name: Not on file  . Number of children: Not on file  . Years of education: Not on file  . Highest education level: Not on file  Occupational History  . Not on file  Social Needs  . Financial resource strain: Not on file  . Food insecurity:    Worry: Not on file    Inability: Not on file  . Transportation needs:    Medical: Not on file    Non-medical: Not on file  Tobacco Use  . Smoking status: Former Smoker    Packs/day: 0.25    Years: 31.00    Pack years: 7.75    Types: Cigarettes    Last attempt to quit: 09/24/2009    Years since quitting: 8.7  . Smokeless tobacco: Never Used  Substance and Sexual Activity  . Alcohol use: Yes  . Drug use: No  . Sexual activity: Yes    Partners: Male    Birth control/protection: Condom, Surgical    Comment:  BTL  Lifestyle  . Physical activity:    Days per week: Not on file    Minutes per session: Not on file  . Stress: Not on file  Relationships  . Social connections:    Talks on phone: Not on file    Gets together: Not on file    Attends religious service: Not on file    Active member of club or organization: Not on file    Attends meetings of clubs or organizations: Not on file    Relationship status: Not on file  . Intimate partner violence:    Fear of current or ex partner: Not on file    Emotionally abused: Not on file    Physically abused: Not on file    Forced sexual activity: Not on file  Other Topics Concern  . Not on file  Social History Narrative   Works with [employer] in [place]  Constitutional: Negative.   HENT: Negative.   Eyes: Negative.   Respiratory: Negative.   Cardiovascular: Negative.   Gastrointestinal: Negative.   Endocrine: Negative.   Genitourinary: Positive for vaginal discharge.       Vulvar lumps irritation  Musculoskeletal: Negative.   Skin: Negative.   Allergic/Immunologic: Negative.   Neurological: Negative.   Hematological: Negative.   Psychiatric/Behavioral: Negative.   All other systems reviewed and are negative.   PHYSICAL EXAMINATION:    BP (!) 160/100   Wt 183 lb (83 kg)   BMI 31.41 kg/m     General appearance: alert, cooperative and appears stated age    Pelvic: External genitalia:  Thick condyloma of the perineum.               Urethra:  normal appearing urethra with no masses, tenderness or lesions              Bartholins and Skenes: normal                 Vagina: normal appearing vagina with normal color and discharge, no lesions              Cervix: no lesions  Bimanual Exam:  Uterus:  normal size, contour, position, consistency, mobility, non-tender              Adnexa: no mass, fullness, tenderness          Wet prep - pH 5, clue cells noted, no yeast. Chaperone was  present for exam.  ASSESSMENT  Vaginitis.  Hx recurrent BV.  PLAN  Nuswab taken. We discussed options for treatment, and patient will do Tindamax 1 gram po x 5 days.  Diflucan 150 mg po x 1 for yeast infection.  May repeat in 72 hours prn.  She will then return 48 - 72 hours after completing Tindamax and have repeat evaluation. If results are negative for BV, she will start Metrogel twice weekly for 4 - 6 months for prevention of BV. Rx for Aldara.  Instructed in use.   An After Visit Summary was printed and given to the patient.  __25____ minutes face to face time of which over 50% was spent in counseling.

## 2018-06-06 ENCOUNTER — Ambulatory Visit (INDEPENDENT_AMBULATORY_CARE_PROVIDER_SITE_OTHER): Payer: 59 | Admitting: Obstetrics and Gynecology

## 2018-06-06 ENCOUNTER — Encounter: Payer: Self-pay | Admitting: Obstetrics and Gynecology

## 2018-06-06 VITALS — BP 160/100 | Wt 183.0 lb

## 2018-06-06 DIAGNOSIS — N761 Subacute and chronic vaginitis: Secondary | ICD-10-CM | POA: Diagnosis not present

## 2018-06-06 DIAGNOSIS — A630 Anogenital (venereal) warts: Secondary | ICD-10-CM | POA: Diagnosis not present

## 2018-06-06 MED ORDER — TINIDAZOLE 500 MG PO TABS
ORAL_TABLET | ORAL | 0 refills | Status: DC
Start: 2018-06-06 — End: 2018-06-13

## 2018-06-06 MED ORDER — IMIQUIMOD 5 % EX CREA: TOPICAL_CREAM | CUTANEOUS | 2 refills | Status: DC

## 2018-06-06 MED ORDER — FLUCONAZOLE 150 MG PO TABS: ORAL_TABLET | ORAL | 0 refills | Status: DC

## 2018-06-06 NOTE — Patient Instructions (Signed)
Bacterial Vaginosis Bacterial vaginosis is a vaginal infection that occurs when the normal balance of bacteria in the vagina is disrupted. It results from an overgrowth of certain bacteria. This is the most common vaginal infection among women ages 15-44. Because bacterial vaginosis increases your risk for STIs (sexually transmitted infections), getting treated can help reduce your risk for chlamydia, gonorrhea, herpes, and HIV (human immunodeficiency virus). Treatment is also important for preventing complications in pregnant women, because this condition can cause an early (premature) delivery. What are the causes? This condition is caused by an increase in harmful bacteria that are normally present in small amounts in the vagina. However, the reason that the condition develops is not fully understood. What increases the risk? The following factors may make you more likely to develop this condition:  Having a new sexual partner or multiple sexual partners.  Having unprotected sex.  Douching.  Having an intrauterine device (IUD).  Smoking.  Drug and alcohol abuse.  Taking certain antibiotic medicines.  Being pregnant.  You cannot get bacterial vaginosis from toilet seats, bedding, swimming pools, or contact with objects around you. What are the signs or symptoms? Symptoms of this condition include:  Grey or white vaginal discharge. The discharge can also be watery or foamy.  A fish-like odor with discharge, especially after sexual intercourse or during menstruation.  Itching in and around the vagina.  Burning or pain with urination.  Some women with bacterial vaginosis have no signs or symptoms. How is this diagnosed? This condition is diagnosed based on:  Your medical history.  A physical exam of the vagina.  Testing a sample of vaginal fluid under a microscope to look for a large amount of bad bacteria or abnormal cells. Your health care provider may use a cotton swab  or a small wooden spatula to collect the sample.  How is this treated? This condition is treated with antibiotics. These may be given as a pill, a vaginal cream, or a medicine that is put into the vagina (suppository). If the condition comes back after treatment, a second round of antibiotics may be needed. Follow these instructions at home: Medicines  Take over-the-counter and prescription medicines only as told by your health care provider.  Take or use your antibiotic as told by your health care provider. Do not stop taking or using the antibiotic even if you start to feel better. General instructions  If you have a female sexual partner, tell her that you have a vaginal infection. She should see her health care provider and be treated if she has symptoms. If you have a female sexual partner, he does not need treatment.  During treatment: ? Avoid sexual activity until you finish treatment. ? Do not douche. ? Avoid alcohol as directed by your health care provider. ? Avoid breastfeeding as directed by your health care provider.  Drink enough water and fluids to keep your urine clear or pale yellow.  Keep the area around your vagina and rectum clean. ? Wash the area daily with warm water. ? Wipe yourself from front to back after using the toilet.  Keep all follow-up visits as told by your health care provider. This is important. How is this prevented?  Do not douche.  Wash the outside of your vagina with warm water only.  Use protection when having sex. This includes latex condoms and dental dams.  Limit how many sexual partners you have. To help prevent bacterial vaginosis, it is best to have sex with just   one partner (monogamous).  Make sure you and your sexual partner are tested for STIs.  Wear cotton or cotton-lined underwear.  Avoid wearing tight pants and pantyhose, especially during summer.  Limit the amount of alcohol that you drink.  Do not use any products that  contain nicotine or tobacco, such as cigarettes and e-cigarettes. If you need help quitting, ask your health care provider.  Do not use illegal drugs. Where to find more information:  Centers for Disease Control and Prevention: www.cdc.gov/std  American Sexual Health Association (ASHA): www.ashastd.org  U.S. Department of Health and Human Services, Office on Women's Health: www.womenshealth.gov/ or https://www.womenshealth.gov/a-z-topics/bacterial-vaginosis Contact a health care provider if:  Your symptoms do not improve, even after treatment.  You have more discharge or pain when urinating.  You have a fever.  You have pain in your abdomen.  You have pain during sex.  You have vaginal bleeding between periods. Summary  Bacterial vaginosis is a vaginal infection that occurs when the normal balance of bacteria in the vagina is disrupted.  Because bacterial vaginosis increases your risk for STIs (sexually transmitted infections), getting treated can help reduce your risk for chlamydia, gonorrhea, herpes, and HIV (human immunodeficiency virus). Treatment is also important for preventing complications in pregnant women, because the condition can cause an early (premature) delivery.  This condition is treated with antibiotic medicines. These may be given as a pill, a vaginal cream, or a medicine that is put into the vagina (suppository). This information is not intended to replace advice given to you by your health care provider. Make sure you discuss any questions you have with your health care provider. Document Released: 09/10/2005 Document Revised: 01/14/2017 Document Reviewed: 05/26/2016 Elsevier Interactive Patient Education  2018 Elsevier Inc.  

## 2018-06-08 LAB — NUSWAB VAGINITIS (VG)
Atopobium vaginae: HIGH Score — AB
BVAB 2: HIGH {score} — AB
CANDIDA GLABRATA, NAA: NEGATIVE
Candida albicans, NAA: NEGATIVE
Megasphaera 1: HIGH Score — AB
TRICH VAG BY NAA: NEGATIVE

## 2018-06-12 NOTE — Progress Notes (Signed)
GYNECOLOGY  VISIT   HPI: 52 y.o.   Divorced  [place] American  female   [phone] with Patient's last menstrual period was 05/25/2018.   here for repeat vaginitis testing. Patient completed Tindamax on Tuesday.  Completed Tindamax and took Diflucan once.   Some white discharge.  Maybe a little itching.  No odor.   States she is prone to yeast as much as bacterial vaginosis.  She gets yeast infections after she does the tx for BV.   She is now on Metformin for DM.  GYNECOLOGIC HISTORY: Patient's last menstrual period was 05/25/2018.  Contraception: tubal ligation   Menopausal hormone therapy:  none Last mammogram:  12/20/2017 BI-RADS CATEGORY 1: Negative. Last pap smear:   12/18/2017 normal       OB History    Gravida  4   Para  2   Term  2   Preterm  0   AB  2   Living  2     SAB  0   TAB  2   Ectopic  0   Multiple  0   Live Births  2              Patient Active Problem List   Diagnosis Date Noted  . Hypertension 06/05/2018    Past Medical History:  Diagnosis Date  . Diabetes mellitus    controlled by diet  . History of abnormal Pap smear 08/14/2001   ASCUS with Neg HR HPV  . Hypertension   . 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 Vulvar vestibulitis 2005    Past Surgical History:  Procedure Laterality Date  . BUNIONECTOMY Bilateral   . CESAREAN SECTION     X 2  . CHOLECYSTECTOMY, LAPAROSCOPIC  11/2009  . INDUCED ABORTION    . LAPAROSCOPY N/A 06/16/2013   Procedure: LAPAROSCOPY DIAGNOSTIC;  Surgeon: Lyman Speller, MD;  Location: [facility];  Service: Gynecology;  Laterality: N/A;  . TUBAL LIGATION  2000    Current Outpatient Medications  Medication Sig Dispense Refill  . atenolol (TENORMIN) 50 MG tablet Take 50 mg by mouth daily.    . imiquimod (ALDARA) 5 % cream Apply topically 3 (three) times a week. Apply entire packet of cream to affected areas 3 times weekly.  Apply at night and leave on during  sleeping hours.  Wash off completely after 8 hours. 12 each 2  . indapamide (LOZOL) 1.25 MG tablet TAKE 1 TABLET BY MOUTH EVERY DAY IN THE MORNING  12  . losartan (COZAAR) 100 MG tablet Take 100 mg by mouth daily.    . metFORMIN (GLUCOPHAGE) 500 MG tablet 1 TABLET WITH A MEAL TWICE A DAY ORALLY 30 DAYS  2  . naproxen sodium (ANAPROX) 220 MG tablet Take 440 mg by mouth daily as needed (menstrual cycle).    . Probiotic Product (PROBIOTIC DAILY PO) Take by mouth.    . metroNIDAZOLE (METROGEL) 0.75 % vaginal gel Place 1 Applicatorful vaginally at bedtime. Use for 5 nights (Patient not taking: Reported on 06/13/2018) 70 g 0   No current facility-administered medications for this visit.      ALLERGIES: Betadine [povidone iodine] and Lisinopril  Family History  Problem Relation Age of Onset  . Diabetes Father   . Cirrhosis Father   . Peripheral vascular disease Mother   . Multiple sclerosis Sister   . Other Other   . Diabetes Maternal Uncle   . Hypertension Maternal Uncle   .  Diabetes Paternal Aunt   . Hypertension Paternal Aunt   . Breast cancer Paternal Aunt 54  . Diabetes Maternal Grandmother   . Heart disease Maternal Grandmother        CVA  . Heart disease Maternal Aunt        CABG    Social History   Socioeconomic History  . Marital status: Divorced    Spouse name: Not on file  . Number of children: Not on file  . Years of education: Not on file  . Highest education level: Not on file  Occupational History  . Not on file  Social Needs  . Financial resource strain: Not on file  . Food insecurity:    Worry: Not on file    Inability: Not on file  . Transportation needs:    Medical: Not on file    Non-medical: Not on file  Tobacco Use  . Smoking status: Former Smoker    Packs/day: 0.25    Years: 31.00    Pack years: 7.75    Types: Cigarettes    Last attempt to quit: 09/24/2009    Years since quitting: 8.7  . Smokeless tobacco: Never Used  Substance and Sexual  Activity  . Alcohol use: Yes  . Drug use: No  . Sexual activity: Yes    Partners: Male    Birth control/protection: Condom, Surgical    Comment: BTL  Lifestyle  . Physical activity:    Days per week: Not on file    Minutes per session: Not on file  . Stress: Not on file  Relationships  . Social connections:    Talks on phone: Not on file    Gets together: Not on file    Attends religious service: Not on file    Active member of club or organization: Not on file    Attends meetings of clubs or organizations: Not on file    Relationship status: Not on file  . Intimate partner violence:    Fear of current or ex partner: Not on file    Emotionally abused: Not on file    Physically abused: Not on file    Forced sexual activity: Not on file  Other Topics Concern  . Not on file  Social History Narrative   Works with [employer] in [place]  Constitutional: Negative.   HENT: Negative.   Eyes: Negative.   Respiratory: Negative.   Cardiovascular: Negative.   Gastrointestinal: Negative.   Endocrine: Negative.   Genitourinary: Negative.   Musculoskeletal: Negative.   Skin: Negative.   Allergic/Immunologic: Negative.   Neurological: Negative.   Hematological: Negative.   Psychiatric/Behavioral: Negative.   All other systems reviewed and are negative.   PHYSICAL EXAMINATION:    BP 110/80 (BP Location: Right Arm, Patient Position: Sitting, Cuff Size: Normal)   Pulse 78   Resp 14   Ht 5\' 3"  (1.6 m)   Wt 181 lb 8 oz (82.3 kg)   LMP 05/25/2018   BMI 32.15 kg/m     General appearance: alert, cooperative and appears stated age   Pelvic: External genitalia:  Condyloma of perineal body.               Urethra:  normal appearing urethra with no masses, tenderness or lesions              Bartholins and Skenes: normal  Vagina: normal appearing vagina with normal color and discharge, no lesions              Cervix: no lesions                 Bimanual Exam:  Uterus:  normal size, contour, position, consistency, mobility, non-tender              Adnexa: no mass, fullness, tenderness    Chaperone was present for exam.  ASSESSMENT  Recurrent BV.  Self reported yeast infection with BV treatment.  DM.  Condyloma.    PLAN  Affirm done.  If negative for BV will start tx with Metrogel pv at hs twice weekly and combine this with weekly Diflucan 150 mg po weekly.  Both treatments will be for 4 months.  She will use the Aldara for 3 months.  Return for annual exams and prn with Dr. Talbert Nan.    An After Visit Summary was printed and given to the patient.  __15___ minutes face to face time of which over 50% was spent in counseling.

## 2018-06-13 ENCOUNTER — Ambulatory Visit (INDEPENDENT_AMBULATORY_CARE_PROVIDER_SITE_OTHER): Payer: 59 | Admitting: Obstetrics and Gynecology

## 2018-06-13 ENCOUNTER — Other Ambulatory Visit: Payer: Self-pay

## 2018-06-13 ENCOUNTER — Encounter: Payer: Self-pay | Admitting: Obstetrics and Gynecology

## 2018-06-13 VITALS — BP 110/80 | HR 78 | Resp 14 | Ht 63.0 in | Wt 181.5 lb

## 2018-06-13 DIAGNOSIS — N761 Subacute and chronic vaginitis: Secondary | ICD-10-CM

## 2018-06-14 LAB — VAGINITIS/VAGINOSIS, DNA PROBE
CANDIDA SPECIES: NEGATIVE
Gardnerella vaginalis: NEGATIVE
Trichomonas vaginosis: NEGATIVE

## 2018-06-17 ENCOUNTER — Ambulatory Visit: Payer: 59 | Admitting: Obstetrics and Gynecology

## 2019-01-08 ENCOUNTER — Ambulatory Visit: Payer: 59 | Admitting: Obstetrics and Gynecology

## 2019-01-09 ENCOUNTER — Telehealth: Payer: Self-pay | Admitting: Obstetrics and Gynecology

## 2019-01-09 NOTE — Telephone Encounter (Signed)
I feels she needs to be seen. Needs to consider suppressive therapy. Please schedule her an appointment

## 2019-01-09 NOTE — Telephone Encounter (Signed)
Patient believes she has a yeast infection or BV. Patient stated that she has both that are reoccurring. Patient stated that she is having a "milky" discharge. Patient does not want to come into the office, and is requesting a webex visit.

## 2019-01-09 NOTE — Telephone Encounter (Signed)
Spoke with patient. Patient states that she has recurring BV. 2 weeks ago she began having milky discharge. Now has a fishy vaginal odor. Patient declines an OV due to COVID 19. Advised we are unable to evaluate her via Webex for her current symptoms. Patient states that she has had BV so often she knows this is what it is. Requesting a prescription be sent to her pharmacy for BV and yeast stating she usually gets a yeast infection after the BV treatment. Advised will review with MD and return call.  06/13/2018 negative testing 06/06/2018 BV 03/14/2018 neg affirm, positive on slides for BV 12/18/2017 BV 01/28/2017 BV 12/17/2016 BV 12/14/2015 BV

## 2019-01-09 NOTE — Telephone Encounter (Signed)
Call to patient. Advised will need office visit for evaluation. Appointment scheduled for 01-12-19 at 1000.  Encounter closed.

## 2019-01-12 ENCOUNTER — Encounter: Payer: Self-pay | Admitting: Obstetrics and Gynecology

## 2019-01-12 ENCOUNTER — Other Ambulatory Visit: Payer: Self-pay

## 2019-01-12 ENCOUNTER — Telehealth: Payer: Self-pay | Admitting: Obstetrics and Gynecology

## 2019-01-12 ENCOUNTER — Ambulatory Visit (INDEPENDENT_AMBULATORY_CARE_PROVIDER_SITE_OTHER): Payer: 59 | Admitting: Obstetrics and Gynecology

## 2019-01-12 VITALS — BP 122/80 | HR 80 | Temp 98.2°F | Resp 13 | Ht 63.0 in | Wt 178.0 lb

## 2019-01-12 DIAGNOSIS — N760 Acute vaginitis: Secondary | ICD-10-CM

## 2019-01-12 DIAGNOSIS — B9689 Other specified bacterial agents as the cause of diseases classified elsewhere: Secondary | ICD-10-CM | POA: Diagnosis not present

## 2019-01-12 DIAGNOSIS — A5901 Trichomonal vulvovaginitis: Secondary | ICD-10-CM

## 2019-01-12 DIAGNOSIS — N9089 Other specified noninflammatory disorders of vulva and perineum: Secondary | ICD-10-CM | POA: Diagnosis not present

## 2019-01-12 DIAGNOSIS — Z113 Encounter for screening for infections with a predominantly sexual mode of transmission: Secondary | ICD-10-CM

## 2019-01-12 MED ORDER — FLUCONAZOLE 150 MG PO TABS: 150.0000 mg | ORAL_TABLET | Freq: Every day | ORAL | 0 refills | Status: DC

## 2019-01-12 MED ORDER — BETAMETHASONE VALERATE 0.1 % EX OINT: TOPICAL_OINTMENT | CUTANEOUS | 0 refills | Status: DC

## 2019-01-12 MED ORDER — METRONIDAZOLE 0.75 % VA GEL
VAGINAL | 1 refills | Status: DC
Start: 2019-01-12 — End: 2019-06-05

## 2019-01-12 MED ORDER — METRONIDAZOLE 500 MG PO TABS: 500.0000 mg | ORAL_TABLET | Freq: Two times a day (BID) | ORAL | 0 refills | Status: DC

## 2019-01-12 NOTE — Telephone Encounter (Signed)
Diflucan script sent

## 2019-01-12 NOTE — Telephone Encounter (Signed)
Spoke with patient, requesting Rx for diflucan to take after completing flagyl. Patient states she gets yeast when taking flagyl. Advised I will review with Dr. Talbert Nan and return call, patient agreeable.   Dr. Talbert Nan -please advise on diflucan Rx.

## 2019-01-12 NOTE — Telephone Encounter (Signed)
Patient notified, encounter closed.

## 2019-01-12 NOTE — Patient Instructions (Signed)
Trichomoniasis Trichomoniasis is an STI (sexually transmitted infection) that can affect both women and men. In women, the outer area of the female genitalia (vulva) and the vagina are affected. In men, the penis is mainly affected, but the prostate and other reproductive organs can also be involved. This condition can be treated with medicine. It often has no symptoms (is asymptomatic), especially in men. What are the causes? This condition is caused by an organism called Trichomonas vaginalis. Trichomoniasis most often spreads from person to person (is contagious) through sexual contact. What increases the risk? The following factors may make you more likely to develop this condition:  Having unprotected sexual intercourse.  Having sexual intercourse with a partner who has trichomoniasis.  Having multiple sexual partners.  Having had previous trichomoniasis infections or other STIs. What are the signs or symptoms? In women, symptoms of trichomoniasis include:  Abnormal vaginal discharge that is clear, white, gray, or yellow-green and foamy and has an unusual "fishy" odor.  Itching and irritation of the vagina and vulva.  Burning or pain during urination or sexual intercourse.  Genital redness and swelling. In men, symptoms of trichomoniasis include:  Penile discharge that may be foamy or contain pus.  Pain in the penis. This may happen only when urinating.  Itching or irritation inside the penis.  Burning after urination or ejaculation. How is this diagnosed? In women, this condition may be found during a routine Pap test or physical exam. It may be found in men during a routine physical exam. Your health care provider may perform tests to help diagnose this infection, such as:  Urine tests (men and women).  The following in women: ? Testing the pH of the vagina. ? A vaginal swab test that checks for the Trichomonas vaginalis organism. ? Testing vaginal secretions. Your  health care provider may test you for other STIs, including HIV (human immunodeficiency virus). How is this treated? This condition is treated with medicine taken by mouth (orally), such as metronidazole or tinidazole to fight the infection. Your sexual partner(s) may also need to be tested and treated.  If you are a woman and you plan to become pregnant or think you may be pregnant, tell your health care provider right away. Some medicines that are used to treat the infection should not be taken during pregnancy. Your health care provider may recommend over-the-counter medicines or creams to help relieve itching or irritation. You may be tested for infection again 3 months after treatment. Follow these instructions at home:  Take and use over-the-counter and prescription medicines, including creams, only as told by your health care provider.  Do not have sexual intercourse until one week after you finish your medicine, or until your health care provider approves. Ask your health care provider when you may resume sexual intercourse.  (Women) Do not douche or wear tampons while you have the infection.  Discuss your infection with your sexual partner(s). Make sure that your partner gets tested and treated, if necessary.  Keep all follow-up visits as told by your health care provider. This is important. How is this prevented?  Use condoms every time you have sex. Using condoms correctly and consistently can help protect against STIs.  Avoid having multiple sexual partners.  Talk with your sexual partner about any symptoms that either of you may have, as well as any history of STIs.  Get tested for STIs and STDs (sexually transmitted diseases) before you have sex. Ask your partner to do the same.    Do not have sexual contact if you have symptoms of trichomoniasis or another STI. Contact a health care provider if:  You still have symptoms after you finish your medicine.  You develop pain in  your abdomen.  You have pain when you urinate.  You have bleeding after sexual intercourse.  You develop a rash.  You feel nauseous or you vomit.  You plan to become pregnant or think you may be pregnant. Summary  Trichomoniasis is an STI (sexually transmitted infection) that can affect both women and men.  This condition often has no symptoms (is asymptomatic), especially in men.  You should not have sexual intercourse until one week after you finish your medicine, or until your health care provider approves. Ask your health care provider when you may resume sexual intercourse.  Discuss your infection with your sexual partner. Make sure that your partner gets tested and treated, if necessary. This information is not intended to replace advice given to you by your health care provider. Make sure you discuss any questions you have with your health care provider. Document Released: 03/06/2001 Document Revised: 08/03/2016 Document Reviewed: 08/03/2016 Elsevier Interactive Patient Education  2019 [employer] Inc[employer]  

## 2019-01-12 NOTE — Progress Notes (Signed)
GYNECOLOGY  VISIT   HPI: 53 y.o.   Divorced Black or [place] American Not Hispanic or Latino  female   [phone] with Patient's last menstrual period was 12/31/2018.   here for BV. Per patient has had symptoms of vulvar itching, fishy odor and discharge x3 weeks. The d/c is milky white. Feels irritated. She has condyloma, hasn't used the aldara cream, feels irritated.   Gets itchy on her perineum where the lumps are. She has a large skin tag on her left vulva that gets caught in her underwear  She is with the same partner, interested in STD testing. Not sure if he has other partners.   Patient was treated for BV in 3/19, 5/19, 6/19, 9/19  GYNECOLOGIC HISTORY: Patient's last menstrual period was 12/31/2018. Contraception:Tubal ligation Menopausal hormone therapy:  none        OB History    Gravida  4   Para  2   Term  2   Preterm  0   AB  2   Living  2     SAB  0   TAB  2   Ectopic  0   Multiple  0   Live Births  2              Patient Active Problem List   Diagnosis Date Noted  . Chronic vaginitis 06/13/2018  . Hypertension 06/05/2018    Past Medical History:  Diagnosis Date  . Diabetes mellitus    controlled by diet  . History of abnormal Pap smear 08/14/2001   ASCUS with Neg HR HPV  . Hypertension   . 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 Vulvar vestibulitis 2005    Past Surgical History:  Procedure Laterality Date  . BUNIONECTOMY Bilateral   . CESAREAN SECTION     X 2  . CHOLECYSTECTOMY, LAPAROSCOPIC  11/2009  . INDUCED ABORTION    . LAPAROSCOPY N/A 06/16/2013   Procedure: LAPAROSCOPY DIAGNOSTIC;  Surgeon: Lyman Speller, MD;  Location: [facility];  Service: Gynecology;  Laterality: N/A;  . TUBAL LIGATION  2000    Current Outpatient Medications  Medication Sig Dispense Refill  . atenolol (TENORMIN) 50 MG tablet Take 50 mg by mouth daily.    . indapamide (LOZOL) 1.25 MG tablet TAKE 1 TABLET BY MOUTH EVERY  DAY IN THE MORNING  12  . losartan (COZAAR) 100 MG tablet Take 100 mg by mouth daily.    . naproxen sodium (ANAPROX) 220 MG tablet Take 440 mg by mouth daily as needed (menstrual cycle).    . Probiotic Product (PROBIOTIC DAILY PO) Take by mouth.    . imiquimod (ALDARA) 5 % cream Apply topically 3 (three) times a week. Apply entire packet of cream to affected areas 3 times weekly.  Apply at night and leave on during sleeping hours.  Wash off completely after 8 hours. (Patient not taking: Reported on 01/12/2019) 12 each 2   No current facility-administered medications for this visit.      ALLERGIES: Betadine [povidone iodine] and Lisinopril  Family History  Problem Relation Age of Onset  . Diabetes Father   . Cirrhosis Father   . Peripheral vascular disease Mother   . Multiple sclerosis Sister   . Other Other   . Diabetes Maternal Uncle   . Hypertension Maternal Uncle   . Diabetes Paternal Aunt   . Hypertension Paternal Aunt   . Breast cancer Paternal Aunt 77  .  Diabetes Maternal Grandmother   . Heart disease Maternal Grandmother        CVA  . Heart disease Maternal Aunt        CABG    Social History   Socioeconomic History  . Marital status: Divorced    Spouse name: Not on file  . Number of children: Not on file  . Years of education: Not on file  . Highest education level: Not on file  Occupational History  . Not on file  Social Needs  . Financial resource strain: Not on file  . Food insecurity:    Worry: Not on file    Inability: Not on file  . Transportation needs:    Medical: Not on file    Non-medical: Not on file  Tobacco Use  . Smoking status: Former Smoker    Packs/day: 0.25    Years: 31.00    Pack years: 7.75    Types: Cigarettes    Last attempt to quit: 09/24/2009    Years since quitting: 9.3  . Smokeless tobacco: Never Used  Substance and Sexual Activity  . Alcohol use: Yes  . Drug use: No  . Sexual activity: Yes    Partners: Male    Birth  control/protection: Condom, Surgical    Comment: BTL  Lifestyle  . Physical activity:    Days per week: Not on file    Minutes per session: Not on file  . Stress: Not on file  Relationships  . Social connections:    Talks on phone: Not on file    Gets together: Not on file    Attends religious service: Not on file    Active member of club or organization: Not on file    Attends meetings of clubs or organizations: Not on file    Relationship status: Not on file  . Intimate partner violence:    Fear of current or ex partner: Not on file    Emotionally abused: Not on file    Physically abused: Not on file    Forced sexual activity: Not on file  Other Topics Concern  . Not on file  Social History Narrative   Works with [employer] in [place]  Constitutional: Negative.   HENT: Negative.   Eyes: Negative.   Respiratory: Negative.   Cardiovascular: Negative.   Gastrointestinal: Negative.   Genitourinary:       Menstrual cycle changes Abnormal discharge Vulvar itching Vaginal odor  Musculoskeletal: Negative.   Skin: Negative.   Neurological: Negative.   Endo/Heme/Allergies: Negative.   Psychiatric/Behavioral: Negative.     PHYSICAL EXAMINATION:    BP 122/80 (BP Location: Left Arm, Patient Position: Sitting, Cuff Size: Large)   Pulse 80   Temp 98.2 F (36.8 C) (Oral)   Resp 13   Ht 5\' 3"  (1.6 m)   Wt 178 lb (80.7 kg)   LMP 12/31/2018   BMI 31.53 kg/m     General appearance: alert, cooperative and appears stated age  Pelvic: External genitalia: large skin tag on the left labia majora, several small condyloma vs skin tags on the perineum/perianal region. One small condyloma vs tag on the upper right thigh.               Urethra:  normal appearing urethra with no masses, tenderness or lesions              Bartholins and Skenes: normal  Vagina: normal appearing vagina with an increase in frothy grey vaginal discharge               Cervix: no lesions              Chaperone was present for exam.  Wet prep: ++ clue, ++ trich, ++ wbc KOH: no yeast PH: 5.5   ASSESSMENT Trichomonal vaginitis Recurrent BV Vulvar irritation Multiple skin tags vs condyloma, chronic source of irritation    PLAN Treat with flagyl x 10 days Start metrogel for BV suppression Expedited partner therapy given (along with information) No intercourse until at least one week after they have both been treated, then use condoms F/U in 4 weeks for repeat testing for trich STD screening done Valisone to help with vulvar irritation She has aldara, she will try that, also discussed removal.    An After Visit Summary was printed and given to the patient.  ~25 minutes face to face time of which over 50% was spent in counseling.

## 2019-01-12 NOTE — Telephone Encounter (Signed)
When patient was in office earlier she forgot to ask for a prescription for yeast infection along with the flagyl. [facility] on [address] [phone].

## 2019-01-13 LAB — HEP, RPR, HIV PANEL
HIV Screen 4th Generation wRfx: NONREACTIVE
Hepatitis B Surface Ag: NEGATIVE
RPR Ser Ql: NONREACTIVE

## 2019-01-13 LAB — HEPATITIS C ANTIBODY: Hep C Virus Ab: <0.1 s/co ratio (ref 0.0–0.9)

## 2019-01-14 LAB — GC/CHLAMYDIA PROBE AMP
Chlamydia trachomatis, NAA: NEGATIVE
Neisseria Gonorrhoeae by PCR: NEGATIVE

## 2019-01-16 ENCOUNTER — Telehealth: Payer: Self-pay | Admitting: Obstetrics and Gynecology

## 2019-01-16 NOTE — Telephone Encounter (Signed)
Patient has prescription for metrogel and flagyl. Has not picked up the metrogel yet due to price. She is currently taking flagyl and would like to know if she is supposed to be taking the metrogel at the same time or if she is supposed to start it after the flagyl.

## 2019-01-16 NOTE — Telephone Encounter (Signed)
Spoke with patient. Reviewed instructions for flagyl and metrogel.   Flagyl 500 mg po bid x10 days. After completeing flagyl, start metrogel for BV suppression. Place one applicator full vaginally 2 x a week at hs for 6 months. Patient read back instructions. Reviewed options for GoodRx coupon for metrogel, if needed.   Routing to provider for final review. Patient is agreeable to disposition. Will close encounter.

## 2019-02-09 ENCOUNTER — Other Ambulatory Visit: Payer: Self-pay

## 2019-02-09 ENCOUNTER — Encounter: Payer: Self-pay | Admitting: Obstetrics and Gynecology

## 2019-02-09 ENCOUNTER — Ambulatory Visit (INDEPENDENT_AMBULATORY_CARE_PROVIDER_SITE_OTHER): Payer: 59 | Admitting: Obstetrics and Gynecology

## 2019-02-09 VITALS — BP 118/72 | HR 70 | Resp 16 | Wt 179.0 lb

## 2019-02-09 DIAGNOSIS — N760 Acute vaginitis: Secondary | ICD-10-CM | POA: Diagnosis not present

## 2019-02-09 DIAGNOSIS — Z8619 Personal history of other infectious and parasitic diseases: Secondary | ICD-10-CM | POA: Diagnosis not present

## 2019-02-09 NOTE — Progress Notes (Signed)
GYNECOLOGY  VISIT   HPI: 53 y.o.   Divorced Black or [place] American Not Hispanic or Latino  female   [phone] with Patient's last menstrual period was 01/23/2019 (exact date).   here for F/U in 4 weeks for repeat testing for trich Last month she was treated for trich and recurrent BV. Expedited partner therapy was given. Other STD testing was negative. Not using condoms.  She c/o a 1 week history of vulvar itching and a thick white vaginal bleeding.   GYNECOLOGIC HISTORY: Patient's last menstrual period was 01/23/2019 (exact date). Contraception:BTL Menopausal hormone therapy: none        OB History    Gravida  4   Para  2   Term  2   Preterm  0   AB  2   Living  2     SAB  0   TAB  2   Ectopic  0   Multiple  0   Live Births  2              Patient Active Problem List   Diagnosis Date Noted  . Chronic vaginitis 06/13/2018  . Hypertension 06/05/2018    Past Medical History:  Diagnosis Date  . Diabetes mellitus    controlled by diet  . History of abnormal Pap smear 08/14/2001   ASCUS with Neg HR HPV  . Hypertension   . 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 Substance abuse ([place])    trichomonas 12/2018 treated  . Vulvar vestibulitis 2005    Past Surgical History:  Procedure Laterality Date  . BUNIONECTOMY Bilateral   . CESAREAN SECTION     X 2  . CHOLECYSTECTOMY, LAPAROSCOPIC  11/2009  . INDUCED ABORTION    . LAPAROSCOPY N/A 06/16/2013   Procedure: LAPAROSCOPY DIAGNOSTIC;  Surgeon: Lyman Speller, MD;  Location: [facility];  Service: Gynecology;  Laterality: N/A;  . TUBAL LIGATION  2000    Current Outpatient Medications  Medication Sig Dispense Refill  . atenolol (TENORMIN) 50 MG tablet Take 50 mg by mouth daily.    . betamethasone valerate ointment (VALISONE) 0.1 % Use a pea sized amount topically BID for 1-2 weeks as needed 15 g 0  . indapamide (LOZOL) 1.25 MG tablet TAKE 1 TABLET BY MOUTH EVERY DAY IN THE  MORNING  12  . losartan (COZAAR) 100 MG tablet Take 100 mg by mouth daily.    . metroNIDAZOLE (METROGEL) 0.75 % vaginal gel After completing the oral flagyl, use one applicator or metrogel 2 x a week at hs for 6 months. 140 g 1  . naproxen sodium (ANAPROX) 220 MG tablet Take 440 mg by mouth daily as needed (menstrual cycle).    . Probiotic Product (PROBIOTIC DAILY PO) Take by mouth.     No current facility-administered medications for this visit.      ALLERGIES: Betadine [povidone iodine] and Lisinopril  Family History  Problem Relation Age of Onset  . Diabetes Father   . Cirrhosis Father   . Peripheral vascular disease Mother   . Multiple sclerosis Sister   . Other Other   . Diabetes Maternal Uncle   . Hypertension Maternal Uncle   . Diabetes Paternal Aunt   . Hypertension Paternal Aunt   . Breast cancer Paternal Aunt 57  . Diabetes Maternal Grandmother   . Heart disease Maternal Grandmother        CVA  . Heart disease Maternal Aunt  CABG    Social History   Socioeconomic History  . Marital status: Divorced    Spouse name: Not on file  . Number of children: Not on file  . Years of education: Not on file  . Highest education level: Not on file  Occupational History  . Not on file  Social Needs  . Financial resource strain: Not on file  . Food insecurity:    Worry: Not on file    Inability: Not on file  . Transportation needs:    Medical: Not on file    Non-medical: Not on file  Tobacco Use  . Smoking status: Former Smoker    Packs/day: 0.25    Years: 31.00    Pack years: 7.75    Types: Cigarettes    Last attempt to quit: 09/24/2009    Years since quitting: 9.3  . Smokeless tobacco: Never Used  Substance and Sexual Activity  . Alcohol use: Yes    Comment: 48 ounces of beer 4 days a week  . Drug use: No  . Sexual activity: Yes    Partners: Male    Birth control/protection: Surgical    Comment: BTL  Lifestyle  . Physical activity:    Days per week:  Not on file    Minutes per session: Not on file  . Stress: Not on file  Relationships  . Social connections:    Talks on phone: Not on file    Gets together: Not on file    Attends religious service: Not on file    Active member of club or organization: Not on file    Attends meetings of clubs or organizations: Not on file    Relationship status: Not on file  . Intimate partner violence:    Fear of current or ex partner: Not on file    Emotionally abused: Not on file    Physically abused: Not on file    Forced sexual activity: Not on file  Other Topics Concern  . Not on file  Social History Narrative   Works with [employer] in [place]  Constitutional: Negative.   HENT: Negative.   Eyes: Negative.   Respiratory: Negative.   Cardiovascular: Negative.   Gastrointestinal: Negative.   Genitourinary: Negative.   Musculoskeletal: Negative.   Skin:       Vaginal itching  Neurological: Negative.   Endo/Heme/Allergies: Negative.   Psychiatric/Behavioral: Negative.     PHYSICAL EXAMINATION:    BP 118/72   Pulse 70   Resp 16   Wt 179 lb (81.2 kg)   LMP 01/23/2019 (Exact Date)   BMI 31.71 kg/m     General appearance: alert, cooperative and appears stated age  Pelvic: External genitalia:  no lesions              Urethra:  normal appearing urethra with no masses, tenderness or lesions              Bartholins and Skenes: normal                 Vagina: normal appearing vagina with an increase in thick yellow/white vaginal d/c              Cervix: no lesions               Chaperone was present for exam.  Wet prep: no clue, no trich, ++ wbc KOH: no yeast PH: 4-4.5   ASSESSMENT H/O trich, s/p treatment.  Here for test of cure C/O vulvar itching and increased vaginal discharge.     PLAN Send nuswab for vaginitis Use steroid ointment bid for up to 1 week (she has some left over) Further treatment depending on results   An After Visit  Summary was printed and given to the patient.

## 2019-02-22 LAB — NUSWAB VAGINITIS (VG)
Candida albicans, NAA: NEGATIVE
Candida glabrata, NAA: NEGATIVE
Trich vag by NAA: NEGATIVE

## 2019-03-03 ENCOUNTER — Other Ambulatory Visit: Payer: Self-pay | Admitting: Family Medicine

## 2019-03-03 DIAGNOSIS — Z1231 Encounter for screening mammogram for malignant neoplasm of breast: Secondary | ICD-10-CM

## 2019-03-17 ENCOUNTER — Other Ambulatory Visit: Payer: Self-pay

## 2019-03-17 ENCOUNTER — Ambulatory Visit
Admission: RE | Admit: 2019-03-17 | Discharge: 2019-03-17 | Disposition: A | Discharge status: Home / Self Care | Payer: 59 | Source: Ambulatory Visit | Attending: Family Medicine | Admitting: Family Medicine

## 2019-03-17 DIAGNOSIS — Z1231 Encounter for screening mammogram for malignant neoplasm of breast: Secondary | ICD-10-CM

## 2019-06-05 ENCOUNTER — Other Ambulatory Visit: Payer: Self-pay

## 2019-06-05 ENCOUNTER — Encounter (HOSPITAL_COMMUNITY): Payer: Self-pay | Admitting: Emergency Medicine

## 2019-06-05 ENCOUNTER — Emergency Department (HOSPITAL_COMMUNITY): Payer: 59

## 2019-06-05 ENCOUNTER — Emergency Department (HOSPITAL_COMMUNITY)
Admission: EM | Admit: 2019-06-05 | Discharge: 2019-06-05 | Disposition: A | Discharge status: Home / Self Care | Payer: 59 | Attending: Emergency Medicine | Admitting: Emergency Medicine

## 2019-06-05 DIAGNOSIS — Z87891 Personal history of nicotine dependence: Secondary | ICD-10-CM | POA: Insufficient documentation

## 2019-06-05 DIAGNOSIS — Z79899 Other long term (current) drug therapy: Secondary | ICD-10-CM | POA: Diagnosis not present

## 2019-06-05 DIAGNOSIS — R030 Elevated blood-pressure reading, without diagnosis of hypertension: Secondary | ICD-10-CM

## 2019-06-05 DIAGNOSIS — M62838 Other muscle spasm: Secondary | ICD-10-CM | POA: Insufficient documentation

## 2019-06-05 DIAGNOSIS — M7918 Myalgia, other site: Secondary | ICD-10-CM | POA: Diagnosis not present

## 2019-06-05 DIAGNOSIS — E119 Type 2 diabetes mellitus without complications: Secondary | ICD-10-CM | POA: Insufficient documentation

## 2019-06-05 DIAGNOSIS — M25511 Pain in right shoulder: Secondary | ICD-10-CM | POA: Diagnosis present

## 2019-06-05 DIAGNOSIS — I10 Essential (primary) hypertension: Secondary | ICD-10-CM | POA: Diagnosis not present

## 2019-06-05 LAB — CBC WITH DIFFERENTIAL/PLATELET
Abs Immature Granulocytes: 0.02 K/uL (ref 0.00–0.07)
Basophils Absolute: 0.1 K/uL (ref 0.0–0.1)
Basophils Relative: 1 %
Eosinophils Absolute: 0.2 K/uL (ref 0.0–0.5)
Eosinophils Relative: 2 %
HCT: 41.1 % (ref 36.0–46.0)
Hemoglobin: 14 g/dL (ref 12.0–15.0)
Immature Granulocytes: 0 %
Lymphocytes Relative: 34 %
Lymphs Abs: 2.6 K/uL (ref 0.7–4.0)
MCH: 32.3 pg (ref 26.0–34.0)
MCHC: 34.1 g/dL (ref 30.0–36.0)
MCV: 94.9 fL (ref 80.0–100.0)
Monocytes Absolute: 0.5 K/uL (ref 0.1–1.0)
Monocytes Relative: 6 %
Neutro Abs: 4.3 K/uL (ref 1.7–7.7)
Neutrophils Relative %: 57 %
Platelets: 287 K/uL (ref 150–400)
RBC: 4.33 MIL/uL (ref 3.87–5.11)
RDW: 12.9 % (ref 11.5–15.5)
WBC: 7.6 K/uL (ref 4.0–10.5)
nRBC: 0 % (ref 0.0–0.2)

## 2019-06-05 LAB — BASIC METABOLIC PANEL
Anion gap: 10 (ref 5–15)
BUN: 13 mg/dL (ref 6–20)
CO2: 23 mmol/L (ref 22–32)
Calcium: 9.8 mg/dL (ref 8.9–10.3)
Chloride: 104 mmol/L (ref 98–111)
Creatinine, Ser: 0.79 mg/dL (ref 0.44–1.00)
GFR calc Af Amer: >60 mL/min (ref >60)
GFR calc non Af Amer: >60 mL/min (ref >60)
Glucose, Bld: 148 mg/dL — ABNORMAL HIGH (ref 70–99)
Potassium: 3.6 mmol/L (ref 3.5–5.1)
Sodium: 137 mmol/L (ref 135–145)

## 2019-06-05 LAB — I-STAT BETA HCG BLOOD, ED (MC, WL, AP ONLY): I-stat hCG, quantitative: <5 m[IU]/mL (ref <5)

## 2019-06-05 MED ORDER — METHOCARBAMOL 500 MG PO TABS: 500.0000 mg | ORAL_TABLET | Freq: Three times a day (TID) | ORAL | 0 refills | Status: DC

## 2019-06-05 MED ORDER — METHOCARBAMOL 500 MG PO TABS
500.0000 mg | ORAL_TABLET | Freq: Once | ORAL | Status: AC
  Administered 2019-06-05: 12:00:00 500 mg via ORAL
  Filled 2019-06-05: qty 1

## 2019-06-05 MED ORDER — LIDOCAINE 5 % EX PTCH
1.0000 | MEDICATED_PATCH | CUTANEOUS | Status: DC
  Administered 2019-06-05: 1 via TRANSDERMAL
  Filled 2019-06-05: qty 1

## 2019-06-05 MED ORDER — KETOROLAC TROMETHAMINE 15 MG/ML IJ SOLN
15.0000 mg | Freq: Once | INTRAMUSCULAR | Status: AC
  Administered 2019-06-05: 15 mg via INTRAMUSCULAR
  Filled 2019-06-05: qty 1

## 2019-06-05 MED ORDER — LIDOCAINE 5 % EX PTCH: 1.0000 | MEDICATED_PATCH | CUTANEOUS | 0 refills | Status: DC

## 2019-06-05 MED ORDER — HYDROCODONE-ACETAMINOPHEN 5-325 MG PO TABS
1.0000 | ORAL_TABLET | Freq: Once | ORAL | Status: AC
  Administered 2019-06-05: 1 via ORAL
  Filled 2019-06-05: qty 1

## 2019-06-05 NOTE — ED Triage Notes (Signed)
Patient reports right shoulder pain radiating to right arm with tingling /numbness onset Monday this week , denies injury or fever . Hypertensive at triage .

## 2019-06-05 NOTE — ED Notes (Addendum)
Patient is ambulatory and is going to be picked up at the front of the ER by her son.

## 2019-06-05 NOTE — Discharge Instructions (Signed)
You have been diagnosed today with musculoskeletal pain due to muscle spasm. Elevated blood pressure reading.  At this time there does not appear to be the presence of an emergent medical condition, however there is always the potential for conditions to change. Please read and follow the below instructions.  Please return to the Emergency Department immediately for any new or worsening symptoms. Please be sure to follow up with your Primary Care Provider within one week regarding your visit today; please call their office to schedule an appointment even if you are feeling better for a follow-up visit. You have been given an NSAID-containing medication called Toradol today.  Do not take the medications including ibuprofen, Aleve, Advil, naproxen or other NSAID-containing medications for the next 2 days.  Please be sure to drink plenty of water over the next few days. You may use the muscle relaxer Robaxin as prescribed to help with your symptoms.  Do not drive or operate heavy machinery while taking Robaxin as it will make you drowsy.  Do not drink alcohol or take other sedating medications while taking Robaxin as this will worsen side effects. You may use the Lidoderm patch as prescribed to help with your symptoms.  Light stretching and massage may also help with your muscle spasm. Your blood pressure was elevated today.  Please call your primary care doctor's office to schedule blood pressure recheck and medication management within 1 week.  Get help right away if: You have a new injury and your pain is worse or different. You feel numb or you have tingling in the painful area. You have chest pain or trouble breathing You have fever or chills You have cough You have abdominal pain, nausea/vomiting or diarrhea You have swelling or color change Get a very bad headache. Start to feel mixed up (confused). Feel weak or numb. Feel faint. You have any new/concerning or worsening symptoms  Please  read the additional information packets attached to your discharge summary.  Do not take your medicine if  develop an itchy rash, swelling in your mouth or lips, or difficulty breathing; call 911 and seek immediate emergency medical attention if this occurs.

## 2019-06-05 NOTE — ED Notes (Signed)
Patient given crackers and sprite for oral medications because she stated she cannot take po meds on an empty stomach. PA notified.

## 2019-06-05 NOTE — ED Provider Notes (Signed)
[facility] Provider Note   CSN: [id number] Arrival date & time: 06/05/19  0601     History   Chief Complaint Chief Complaint  Patient presents with   Shoulder Pain    HPI Kayla Rojas is a 53 y.o. female with history of diabetes, hypertension presents today for right shoulder pain that began 06/01/2019.  Patient reports that she had done some extensive cleaning of her home on Monday morning and then later on that afternoon noticed a twinge of pain in her right trapezius area.  Patient reports that when she woke up the next morning the pain had gotten worse and has gradually progressed.  She describes a aching/throbbing sensation moderate intensity along the right shoulder blade and trapezius muscle that has been constant worsened with movement and improved with resting her right arm on top of her head.  She reports pain will occasionally radiate to her right shoulder and arm.  Patient denies fever/chills, headache/vision changes, fall/injury, neck pain, chest pain, cough/shortness of breath, swelling of the extremities, numbness/tingling, weakness, nausea/vomiting, abdominal pain or any additional concerns.     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)    trichomonas 12/2018 treated   Vulvar vestibulitis 2005    Patient Active Problem List   Diagnosis Date Noted   Chronic vaginitis 06/13/2018   Hypertension 06/05/2018    Past Surgical History:  Procedure Laterality Date   BUNIONECTOMY Bilateral    CESAREAN SECTION     X 2   CHOLECYSTECTOMY, LAPAROSCOPIC  11/2009   INDUCED ABORTION     LAPAROSCOPY N/A 06/16/2013   Procedure: LAPAROSCOPY DIAGNOSTIC;  Surgeon: Lyman Speller, MD;  Location: [facility];  Service: Gynecology;  Laterality: N/A;   TUBAL LIGATION  2000     OB History    Gravida  4   Para  2   Term  2   Preterm  0   AB  2   Living  2     SAB  0   TAB  2   Ectopic  0   Multiple  0   Live Births  2            Home Medications    Prior to Admission medications   Medication Sig Start Date End Date Taking? Authorizing Provider  atenolol (TENORMIN) 50 MG tablet Take 50 mg by mouth daily.   Yes [provider]  betamethasone valerate ointment (VALISONE) 0.1 % Use a pea sized amount topically BID for 1-2 weeks as needed Patient taking differently: Apply 1 application topically 2 (two) times daily as needed (rash).  01/12/19  Yes Salvadore Dom, MD  Cholecalciferol (VITAMIN D3) 125 MCG (5000 UT) CAPS Take 1 capsule by mouth daily.   Yes [provider]  indapamide (LOZOL) 1.25 MG tablet Take 1.25 mg by mouth daily.  02/20/18  Yes [provider]  naproxen sodium (ANAPROX) 220 MG tablet Take 440 mg by mouth daily as needed (menstrual cycle). 06/01/13  Yes Megan Salon, MD  olmesartan (BENICAR) 40 MG tablet Take 40 mg by mouth daily.   Yes [provider]  Probiotic Product (PROBIOTIC DAILY PO) Take 1 capsule  by mouth daily.    Yes [provider]  vitamin B-12 (CYANOCOBALAMIN) 100 MCG tablet Take 100 mcg by mouth daily.   Yes [provider]  lidocaine (LIDODERM) 5 % Place 1 patch onto the skin daily. Remove & Discard patch within 12 hours or as directed by MD 06/05/19   Deliah Boston, PA-C  methocarbamol (ROBAXIN) 500 MG tablet Take 1 tablet (500 mg total) by mouth 3 (three) times daily. 06/05/19   Deliah Boston, PA-C    Family History Family History  Problem Relation Age of Onset   Diabetes Father    Cirrhosis Father    Peripheral vascular disease Mother    Multiple sclerosis Sister    Other Other    Diabetes Maternal Uncle    Hypertension Maternal Uncle    Diabetes  Paternal Aunt    Hypertension Paternal Aunt    Breast cancer Paternal Aunt 69   Diabetes Maternal Grandmother    Heart disease Maternal Grandmother        CVA   Heart disease Maternal Aunt        CABG    Social History Social History   Tobacco Use   Smoking status: Former Smoker    Packs/day: 0.25    Years: 31.00    Pack years: 7.75    Types: Cigarettes    Quit date: 09/24/2009    Years since quitting: 9.7   Smokeless tobacco: Never Used  Substance Use Topics   Alcohol use: Yes    Comment: 48 ounces of beer 4 days a week   Drug use: No     Allergies   Lisinopril and Betadine [povidone iodine]   Review of Systems Review of Systems Ten systems are reviewed and are negative for acute change except as noted in the HPI   Physical Exam Updated Vital Signs BP (!) 153/93    Pulse 74    Temp 99 F (37.2 C) (Oral)    Resp 16    Ht 5\' 6"  (1.676 m)    Wt 83.9 kg    SpO2 98%    BMI 29.86 kg/m   Physical Exam Constitutional:      General: She is not in acute distress.    Appearance: Normal appearance. She is well-developed. She is not ill-appearing or diaphoretic.  HENT:     Head: Normocephalic and atraumatic.     Right Ear: External ear normal.     Left Ear: External ear normal.     Nose: Nose normal.  Eyes:     General: Vision grossly intact. Gaze aligned appropriately.     Pupils: Pupils are equal, round, and reactive to light.  Neck:     Musculoskeletal: Normal range of motion.     Trachea: Trachea and phonation normal. No tracheal deviation.  Pulmonary:     Effort: Pulmonary effort is normal. No respiratory distress.  Chest:     Chest wall: No tenderness.  Abdominal:     General: There is no distension.     Palpations: Abdomen is soft.     Tenderness: There is no abdominal tenderness. There is no guarding or rebound.  Musculoskeletal: Normal range of motion.     Right shoulder: She exhibits tenderness and spasm. She exhibits no bony tenderness, no  swelling and no deformity.       Back:     Comments: Patient with muscular spasm and point tenderness of the right rhomboid muscle.  Also with tenderness to palpation of the  right trapezius and supraspinatus.  No sign of injury. - No midline C/T/L spinal tenderness to palpation, no paraspinal muscle tenderness, no deformity, crepitus, or step-off noted. No sign of injury to the neck or back. - Sensation and capillary refill intact to all 10 fingers.  Appropriate range of motion and strength with all movements of the bilateral upper extremities.  Compartments soft to palpation.  No swelling or color change.  Patient reports pain is improved with placing the right forearm on top of her head.  Skin:    General: Skin is warm and dry.  Neurological:     Mental Status: She is alert.     GCS: GCS eye subscore is 4. GCS verbal subscore is 5. GCS motor subscore is 6.     Comments: Speech is clear and goal oriented, follows commands Major Cranial nerves without deficit, no facial droop Moves extremities without ataxia, coordination intact  Psychiatric:        Behavior: Behavior normal.      ED Treatments / Results  Labs (all labs ordered are listed, but only abnormal results are displayed) Labs Reviewed  BASIC METABOLIC PANEL - Abnormal; Notable for the following components:      Result Value   Glucose, Bld 148 (*)    All other components within normal limits  CBC WITH DIFFERENTIAL/PLATELET  I-STAT BETA HCG BLOOD, ED ([facility], [facility], [facility] ONLY)    EKG EKG Interpretation  Date/Time:  Friday June 05 2019 06:13:58 EDT Ventricular Rate:  84 PR Interval:  128 QRS Duration: 80 QT Interval:  380 QTC Calculation: 449 R Axis:   76 Text Interpretation:  Normal sinus rhythm with sinus arrhythmia ST & T wave abnormality, consider anterior ischemia Abnormal ECG no change from previous Confirmed by Charlesetta Shanks [phone]) on 06/05/2019 10:53:02 AM   Radiology Dg Shoulder Right  Result Date:  06/05/2019 CLINICAL DATA:  Acute right shoulder pain without known injury. EXAM: RIGHT SHOULDER - 2+ VIEW COMPARISON:  None. FINDINGS: There is no evidence of fracture or dislocation. There is no evidence of arthropathy or other focal bone abnormality. Soft tissues are unremarkable. IMPRESSION: Negative. Electronically Signed   By: Marijo Conception M.D.   On: 06/05/2019 11:42    Procedures Procedures (including critical care time)  Medications Ordered in ED Medications  lidocaine (LIDODERM) 5 % 1 patch (1 patch Transdermal Patch Applied 06/05/19 1135)  ketorolac (TORADOL) 15 MG/ML injection 15 mg (15 mg Intramuscular Given 06/05/19 1144)  methocarbamol (ROBAXIN) tablet 500 mg (500 mg Oral Given 06/05/19 1148)  HYDROcodone-acetaminophen (NORCO/VICODIN) 5-325 MG per tablet 1 tablet (1 tablet Oral Given 06/05/19 1148)     Initial Impression / Assessment and Plan / ED Course  I have reviewed the triage vital signs and the nursing notes.  Pertinent labs & imaging results that were available during my care of the patient were reviewed by me and considered in my medical decision making (see chart for details).    Blood work and EKG obtained in triage. - Beta-hCG negative CBC within normal limits BMP with glucose 148, otherwise within normal limits EKG: Normal sinus rhythm with sinus arrhythmia ST & T wave abnormality, consider anterior ischemia Abnormal ECG no change from previous Confirmed by Charlesetta Shanks [phone]) on 06/05/2019 10:53:02 AM ==== Case discussed with Dr. Vallery Ridge, patient with muscular spasm of the right rhomboid muscle with tenderness of the trapezius and supraspinatus x 5 days, pain is improved with position of the right arm on top of the head.  Patient denies chest pain or shortness of breath.  Suspect patient with musculoskeletal etiology of her pain today, do not suspect ACS, dissection, PE or other acute cardiopulmonary etiologies of her symptoms today, no indication for further  workup at this time.  She is neurovascularly intact to the extremity, no neck pain or radicular symptoms.  Plan of care is to obtain plain imaging or right shoulder and treat for musculoskeletal pain today.  Patient denies history of CKD/gastric ulcers, 15 mg IM Toradol ordered, Norco, Robaxin and Lidoderm patch ordered.  Will reassess post medication. ==== Of note patient was hypertensive on arrival at around 6 AM.  This was prior to taking her blood pressure medications this morning, she gave herself her own home dose antihypertensives around 10 AM with improvement of her blood pressure.  Patient asymptomatic regarding hypertension today.  Patient informed of elevated blood pressure reading today and need for improved management.  She is instructed to follow-up with her primary care provider within 1 week for blood pressure recheck and potential medication management.  She is been informed of the signs and symptoms that would require an emergent visit to the ED for hypertensive urgency/emergency and she states understanding. - DG Right Shoulder:  IMPRESSION:  Negative.  - On reexamination patient is talking on the phone resting comfortably and in no acute distress.  Patient with continuing spasm of the right shoulder, she states some improvement of symptoms after medications today.  No gross instability of the shoulder.  She is neurovascularly intact without evidence of DVT, compartment syndrome, septic joint, cellulitis or other acute pathologies at this time.  She has been given sling for comfort and encouraged to follow-up with her primary care provider.  Discussed home therapies including heating pads and gentle stretching/massage.  She has been prescribed Robaxin for symptomatic relief, discussed precautions guarding muscle relaxers and patient states understanding.  We also discussed avoidance of further NSAIDs for the next 2 days as she has been given Toradol here today.  At this time there does  not appear to be any evidence of an acute emergency medical condition and the patient appears stable for discharge with appropriate outpatient follow up. Diagnosis was discussed with patient who verbalizes understanding of care plan and is agreeable to discharge. I have discussed return precautions with patient who verbalizes understanding of return precautions. Patient encouraged to follow-up with their PCP. All questions answered.  Patient's case rediscussed with Dr. Johnney Killian who agrees with plan to discharge with follow-up.   Note: Portions of this report may have been transcribed using voice recognition software. Every effort was made to ensure accuracy; however, inadvertent computerized transcription errors may still be present. Final Clinical Impressions(s) / ED Diagnoses   Final diagnoses:  Muscle spasm  Musculoskeletal pain  Elevated blood pressure reading    ED Discharge Orders         Ordered    methocarbamol (ROBAXIN) 500 MG tablet  3 times daily     06/05/19 1313    lidocaine (LIDODERM) 5 %  Every 24 hours     06/05/19 1313           Gari Crown 06/05/19 1319    Charlesetta Shanks, MD 06/08/19 1415

## 2019-06-05 NOTE — ED Notes (Signed)
Patient transported to X-ray 

## 2019-06-17 ENCOUNTER — Other Ambulatory Visit: Payer: Self-pay | Admitting: Sports Medicine

## 2019-06-17 DIAGNOSIS — M542 Cervicalgia: Secondary | ICD-10-CM

## 2019-06-17 DIAGNOSIS — M5020 Other cervical disc displacement, unspecified cervical region: Secondary | ICD-10-CM

## 2019-06-24 ENCOUNTER — Other Ambulatory Visit: Payer: 59

## 2019-10-21 IMAGING — MG DIGITAL SCREENING BILATERAL MAMMOGRAM WITH TOMO AND CAD
8 series · 9 of 24 positions shown · non-contrast
Comparison: Previous exam(s).

CLINICAL DATA: Screening.

EXAM:
DIGITAL SCREENING BILATERAL MAMMOGRAM WITH TOMO AND CAD

[L CC synth-2D]
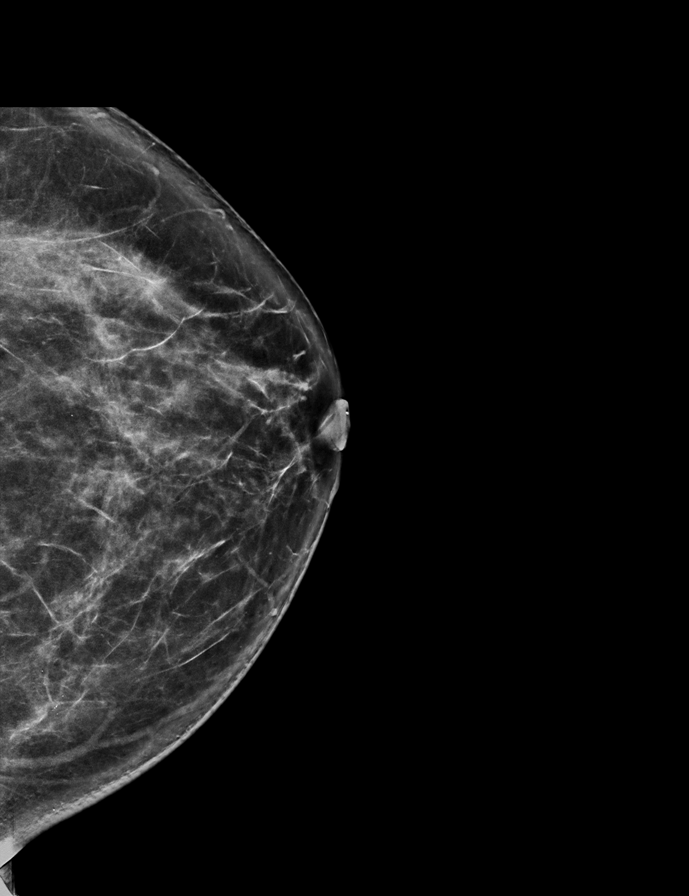

[L MLO synth-2D]
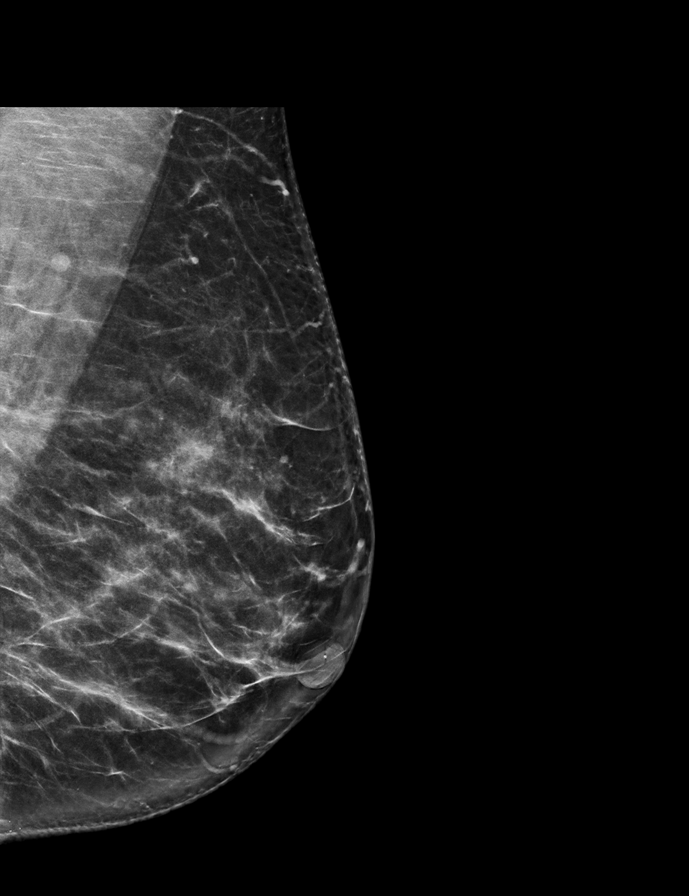

[R CC synth-2D]
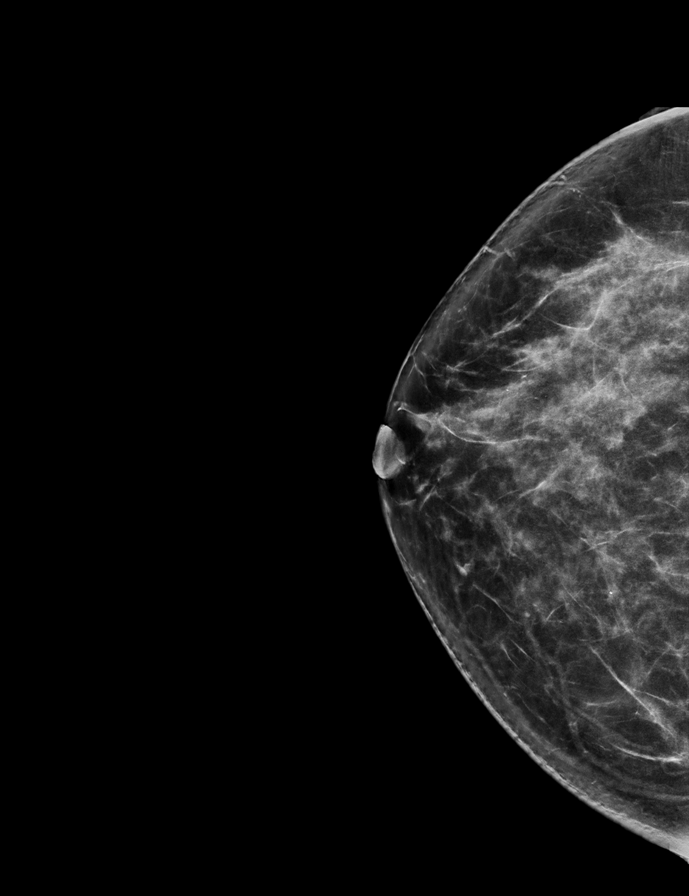

[R MLO synth-2D]
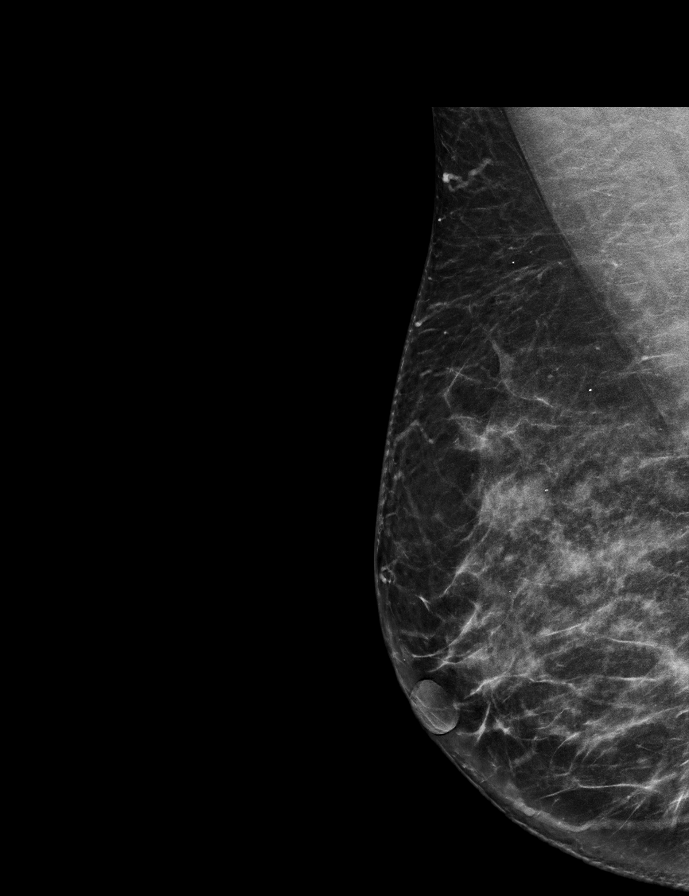

[R CC tomo · 2 of 70 frames shown]
[frame 23/70]
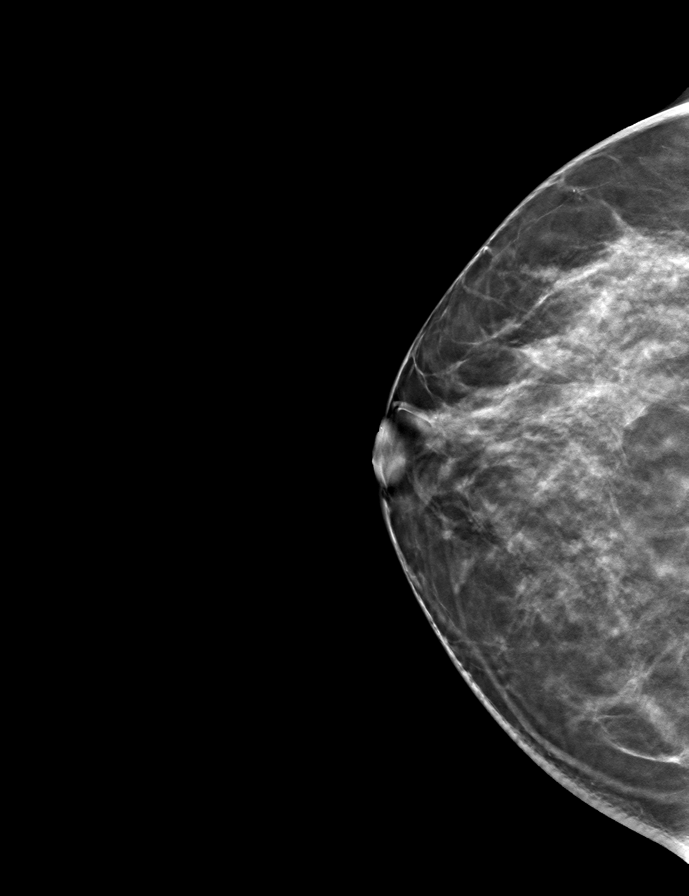
[frame 35/70]
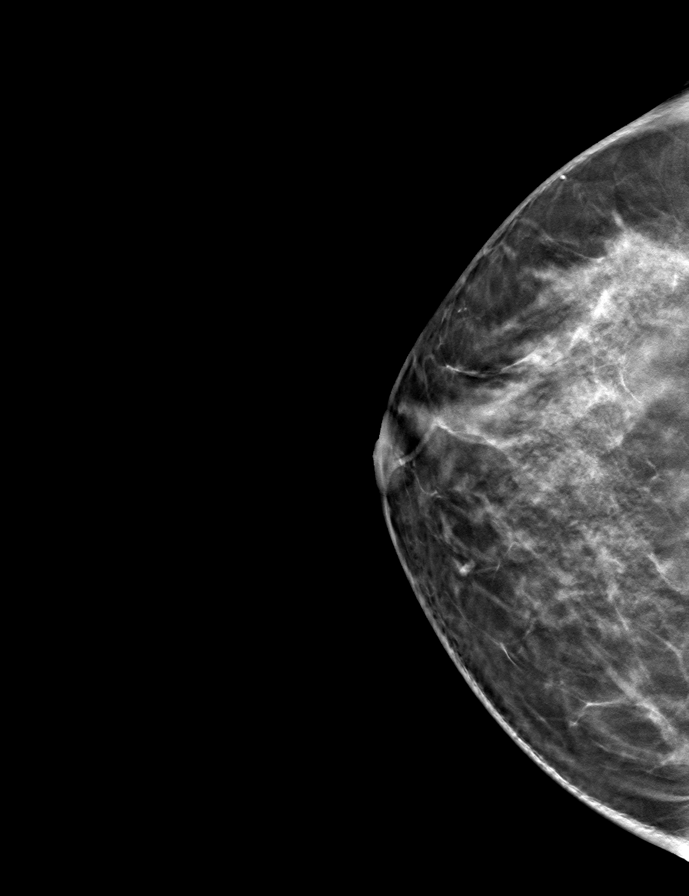

[R MLO tomo · tomo slice 36/71.0]
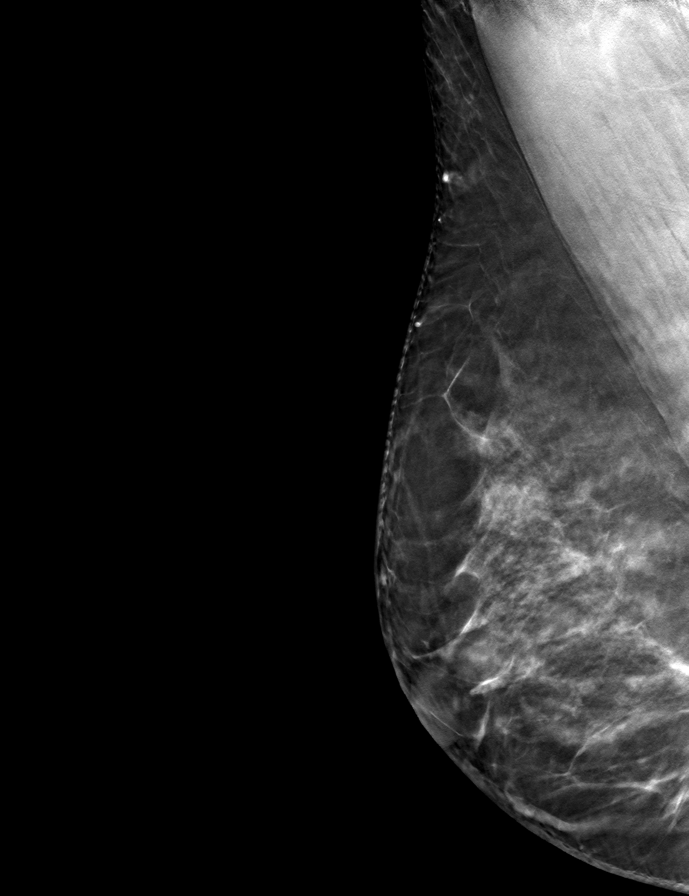

[L CC tomo · tomo slice 37/74.0]
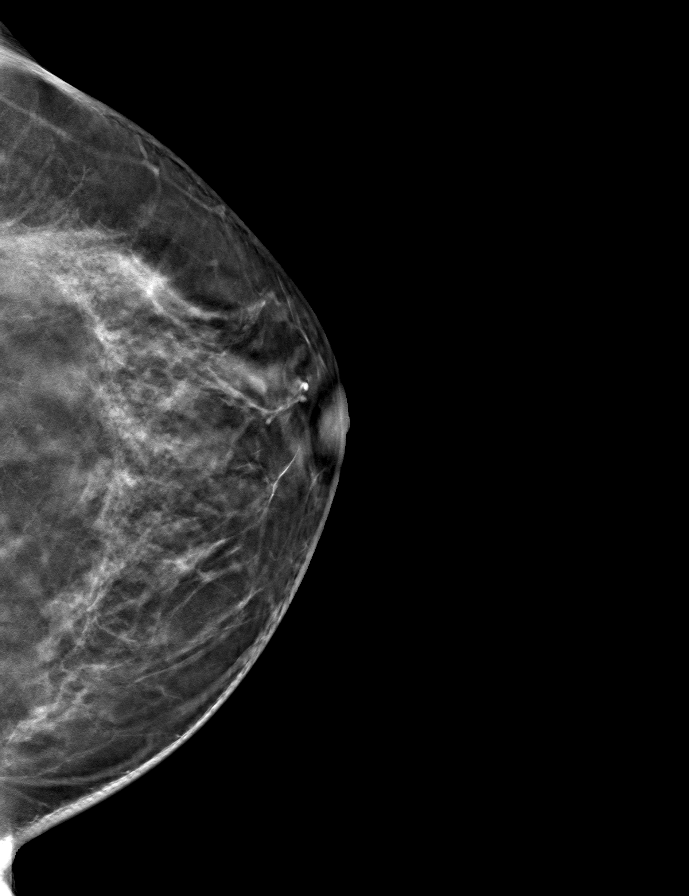

[L MLO tomo · tomo slice 38/75.0]
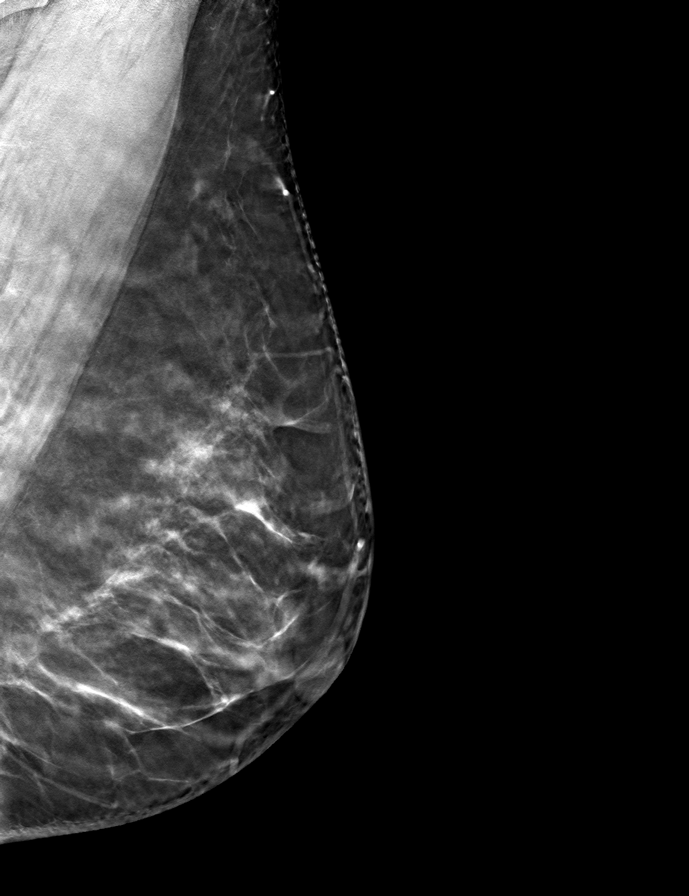

[9 of 24 positions shown; findings below may reference images not displayed]

ACR Breast Density Category c: The breast tissue is heterogeneously
dense, which may obscure small masses.
FINDINGS: There are no findings suspicious for malignancy. Images were
processed with CAD.
IMPRESSION: No mammographic evidence of malignancy. A result letter of this
screening mammogram will be mailed directly to the patient.

RECOMMENDATION:
Screening mammogram in one year. (Code:FT-U-LHB)

BI-RADS CATEGORY  1: Negative.

## 2020-04-28 ENCOUNTER — Other Ambulatory Visit: Payer: Self-pay | Admitting: Family Medicine

## 2020-04-28 DIAGNOSIS — Z1231 Encounter for screening mammogram for malignant neoplasm of breast: Secondary | ICD-10-CM

## 2020-05-10 ENCOUNTER — Ambulatory Visit
Admission: RE | Admit: 2020-05-10 | Discharge: 2020-05-10 | Disposition: A | Discharge status: Home / Self Care | Payer: 59 | Source: Ambulatory Visit | Attending: Family Medicine | Admitting: Family Medicine

## 2020-05-10 ENCOUNTER — Other Ambulatory Visit: Payer: Self-pay

## 2020-05-10 DIAGNOSIS — Z1231 Encounter for screening mammogram for malignant neoplasm of breast: Secondary | ICD-10-CM

## 2021-03-28 ENCOUNTER — Other Ambulatory Visit: Payer: Self-pay | Admitting: Family Medicine

## 2021-03-28 DIAGNOSIS — Z1231 Encounter for screening mammogram for malignant neoplasm of breast: Secondary | ICD-10-CM

## 2021-05-09 ENCOUNTER — Other Ambulatory Visit: Payer: Self-pay

## 2021-05-09 ENCOUNTER — Encounter: Payer: Self-pay | Admitting: Obstetrics and Gynecology

## 2021-05-09 ENCOUNTER — Ambulatory Visit (INDEPENDENT_AMBULATORY_CARE_PROVIDER_SITE_OTHER): Payer: No Typology Code available for payment source | Admitting: Obstetrics and Gynecology

## 2021-05-09 VITALS — BP 124/76 | HR 88 | Ht 64.0 in | Wt 183.0 lb

## 2021-05-09 DIAGNOSIS — N939 Abnormal uterine and vaginal bleeding, unspecified: Secondary | ICD-10-CM

## 2021-05-09 DIAGNOSIS — N898 Other specified noninflammatory disorders of vagina: Secondary | ICD-10-CM

## 2021-05-09 DIAGNOSIS — A630 Anogenital (venereal) warts: Secondary | ICD-10-CM

## 2021-05-09 DIAGNOSIS — Z113 Encounter for screening for infections with a predominantly sexual mode of transmission: Secondary | ICD-10-CM

## 2021-05-09 DIAGNOSIS — Z124 Encounter for screening for malignant neoplasm of cervix: Secondary | ICD-10-CM

## 2021-05-09 MED ORDER — IMIQUIMOD 5 % EX CREA: TOPICAL_CREAM | CUTANEOUS | 2 refills | Status: DC

## 2021-05-09 NOTE — Progress Notes (Signed)
GYNECOLOGY  VISIT   HPI: 55 y.o.   Divorced Black or [place] American Not Hispanic or Latino  female   [phone] with Patient's last menstrual period was 04/22/2021.   here for Patient has been spotting since July 5. She just had her period on 04/22/21. States that she is having a little vaginal burning now.   Prior to July her cycles were monthly. Starting in April she started cramping prior to her cycle, through her cycle and after her cycle. She cycles monthly x 7 days, can saturate a super tampon in up to 2 hours. She takes Aleve for her cramps which helps. Since April the cramps just come more frequently, not more intense.  She had a normal cycle starting on June, 27. She bleed until July 3. Then she started having brown spotting on July 5. Spotting until July 30 when her cycle started. Stopped bleeding 04/28/21. She has spotted since 04/28/21.  She found some metrogel that she had, only had 2 days worth. Now she thinks she may have yeast. Currently she has noticed a thick clumpy vaginal d/c. Slight burning. No odor and no itching.   Sexually active, she would like STD testing.   She has had bumps for 2 years. Didn't use the cream she was prescribed. She has bumps by her rectum that are itchy.    GYNECOLOGIC HISTORY: Patient's last menstrual period was 04/22/2021. 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     betamethasone valerate ointment (VALISONE) 0.1 % Use a pea sized amount topically BID for 1-2 weeks as needed (Patient taking differently: Apply 1 application topically 2 (two) times daily as needed (rash). ) 15 g 0   Cholecalciferol (VITAMIN D3) 125 MCG (5000 UT) CAPS Take 1 capsule by mouth daily.     indapamide (LOZOL) 1.25 MG tablet Take 1.25 mg by mouth daily.   12   lidocaine (LIDODERM) 5 % Place 1 patch onto the skin daily.  Remove & Discard patch within 12 hours or as directed by MD 30 patch 0   methocarbamol (ROBAXIN) 500 MG tablet Take 1 tablet (500 mg total) by mouth 3 (three) times daily. 18 tablet 0   naproxen sodium (ANAPROX) 220 MG tablet Take 440 mg by mouth daily as needed (menstrual cycle).     olmesartan (BENICAR) 40 MG tablet Take 40 mg by mouth daily.     Probiotic Product (PROBIOTIC DAILY PO) Take 1 capsule by mouth daily.      vitamin B-12 (CYANOCOBALAMIN) 100 MCG tablet Take 100 mcg by mouth daily.     No current facility-administered medications for this visit.     ALLERGIES: Lisinopril and Betadine [povidone iodine]  Family History  Problem Relation Age of Onset   Diabetes Father    Cirrhosis Father    Peripheral vascular disease Mother    Multiple sclerosis Sister    Other Other    Diabetes Maternal Uncle    Hypertension Maternal Uncle    Diabetes Paternal Aunt    Hypertension Paternal Aunt    Breast cancer Paternal  Aunt 81   Diabetes Maternal Grandmother    Heart disease Maternal Grandmother        CVA   Heart disease Maternal Aunt        CABG    Social History   Socioeconomic History   Marital status: Divorced    Spouse name: Not on file   Number of children: Not on file   Years of education: Not on file   Highest education level: Not on file  Occupational History   Not on file  Tobacco Use   Smoking status: Former    Packs/day: 0.25    Years: 31.00    Pack years: 7.75    Types: Cigarettes    Quit date: 09/24/2009    Years since quitting: 11.6   Smokeless tobacco: Never  Vaping Use   Vaping Use: Never used  Substance and Sexual Activity   Alcohol use: Yes    Comment: 48 ounces of beer 4 days a week   Drug use: No   Sexual activity: Yes    Partners: Male    Birth control/protection: Surgical    Comment: BTL  Other Topics Concern   Not on file  Social History Narrative   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  PHYSICAL EXAMINATION:    BP 124/76   Pulse 88   Ht '5\' 4"'$  (1.626 m)   Wt 183 lb (83 kg)   LMP 04/22/2021   SpO2 100%   BMI 31.41 kg/m     General appearance: alert, cooperative and appears stated age  Pelvic: External genitalia:  Large skin tag on the left vulva, epidermoid cyst on the right vulva. 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  1. Abnormal uterine bleeding Intermenstrual spotting, concerning for possible polyp - US PELVIS TRANSVAGINAL NON-OB (TV ONLY); Future  2. Vaginal discharge - WET PREP FOR TRICH, YEAST, CLUE: negative wet prep - SureSwab, Vaginosis/Vaginitis Plus  3. Genital warts - imiquimod (ALDARA) 5 % cream; Apply topically 3 (three) times a week. Apply entire packet of cream to affected areas 3 times weekly.  Apply at night and leave on during sleeping hours.  Wash off completely after 8 hours.  Dispense: 12 each; Refill: 2  4. Screening examination for STD (sexually transmitted disease) - SureSwab, Vaginosis/Vaginitis Plus

## 2021-05-10 ENCOUNTER — Telehealth: Payer: Self-pay

## 2021-05-10 LAB — WET PREP FOR TRICH, YEAST, CLUE

## 2021-05-10 NOTE — Telephone Encounter (Signed)
Patient called for results of testing yesterday. Informed patient sure swab test still pending and we will call her with results as soon as they are in and provider reviews.

## 2021-05-11 ENCOUNTER — Other Ambulatory Visit (HOSPITAL_COMMUNITY)
Admission: RE | Admit: 2021-05-11 | Discharge: 2021-05-11 | Disposition: A | Discharge status: Home / Self Care | Payer: No Typology Code available for payment source | Source: Ambulatory Visit | Attending: Obstetrics and Gynecology | Admitting: Obstetrics and Gynecology

## 2021-05-11 DIAGNOSIS — N939 Abnormal uterine and vaginal bleeding, unspecified: Secondary | ICD-10-CM | POA: Diagnosis not present

## 2021-05-11 DIAGNOSIS — Z124 Encounter for screening for malignant neoplasm of cervix: Secondary | ICD-10-CM | POA: Insufficient documentation

## 2021-05-11 NOTE — Addendum Note (Signed)
Addended by: Dorothy Spark on: 05/11/2021 09:16 AM   Modules accepted: Orders

## 2021-05-12 LAB — CYTOLOGY - PAP
Comment: NEGATIVE
Diagnosis: NEGATIVE
High risk HPV: NEGATIVE

## 2021-05-15 ENCOUNTER — Telehealth: Payer: Self-pay

## 2021-05-15 NOTE — Telephone Encounter (Signed)
Called for results. Informed Pap neg with neg HR HPV. Informed vaginitis panel negative.

## 2021-05-16 ENCOUNTER — Other Ambulatory Visit: Payer: Self-pay

## 2021-05-16 ENCOUNTER — Ambulatory Visit (INDEPENDENT_AMBULATORY_CARE_PROVIDER_SITE_OTHER): Payer: No Typology Code available for payment source

## 2021-05-16 ENCOUNTER — Encounter: Payer: Self-pay | Admitting: Obstetrics and Gynecology

## 2021-05-16 ENCOUNTER — Ambulatory Visit: Payer: No Typology Code available for payment source | Admitting: Obstetrics and Gynecology

## 2021-05-16 ENCOUNTER — Telehealth: Payer: Self-pay | Admitting: Obstetrics and Gynecology

## 2021-05-16 VITALS — BP 126/74 | HR 77 | Ht 64.0 in | Wt 183.0 lb

## 2021-05-16 DIAGNOSIS — N946 Dysmenorrhea, unspecified: Secondary | ICD-10-CM

## 2021-05-16 DIAGNOSIS — D250 Submucous leiomyoma of uterus: Secondary | ICD-10-CM

## 2021-05-16 DIAGNOSIS — D251 Intramural leiomyoma of uterus: Secondary | ICD-10-CM

## 2021-05-16 DIAGNOSIS — Z113 Encounter for screening for infections with a predominantly sexual mode of transmission: Secondary | ICD-10-CM

## 2021-05-16 DIAGNOSIS — N921 Excessive and frequent menstruation with irregular cycle: Secondary | ICD-10-CM

## 2021-05-16 DIAGNOSIS — N939 Abnormal uterine and vaginal bleeding, unspecified: Secondary | ICD-10-CM

## 2021-05-16 DIAGNOSIS — E119 Type 2 diabetes mellitus without complications: Secondary | ICD-10-CM | POA: Insufficient documentation

## 2021-05-16 DIAGNOSIS — D252 Subserosal leiomyoma of uterus: Secondary | ICD-10-CM

## 2021-05-16 LAB — SURESWAB, VAGINOSIS/VAGINITIS PLUS
Atopobium vaginae: NOT DETECTED Log (cells/mL)
C. albicans, DNA: NOT DETECTED
C. glabrata, DNA: NOT DETECTED
C. parapsilosis, DNA: NOT DETECTED
C. trachomatis RNA, TMA: NOT DETECTED
C. tropicalis, DNA: NOT DETECTED
Gardnerella vaginalis: <4.7 Log (cells/mL)
LACTOBACILLUS SPECIES: 7.1 Log (cells/mL)
MEGASPHAERA SPECIES: NOT DETECTED Log (cells/mL)
N. gonorrhoeae RNA, TMA: NOT DETECTED
Trichomonas vaginalis RNA: NOT DETECTED

## 2021-05-16 NOTE — Progress Notes (Signed)
GYNECOLOGY  VISIT   HPI: 55 y.o.   Divorced Black or [place] American Not Hispanic or Latino  female   [phone] with Patient's last menstrual period was 04/22/2021.   here for evaluation of menometrorrhagia.    Negative pap last week, cervical cultures are pending.  Cramps are now before her cycle, up to a 8/10 in severity, helped with aleve. Manageable.   Recently started on medication for type 2 DM. Last HgbA1C was 7.3% (last week).  GYNECOLOGIC HISTORY: Patient's last menstrual period was 04/22/2021. 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     betamethasone valerate ointment (VALISONE) 0.1 % Use a pea sized amount topically BID for 1-2 weeks as needed (Patient taking differently: Apply 1 application topically 2 (two) times daily as needed (rash).) 15 g 0    Cholecalciferol (VITAMIN D3) 125 MCG (5000 UT) CAPS Take 1 capsule by mouth daily.     imiquimod (ALDARA) 5 % cream Apply topically 3 (three) times a week. Apply entire packet of cream to affected areas 3 times weekly.  Apply at night and leave on during sleeping hours.  Wash off completely after 8 hours. 12 each 2   indapamide (LOZOL) 2.5 MG tablet Take 2.5 mg by mouth daily.     lidocaine (LIDODERM) 5 % Place 1 patch onto the skin daily. Remove & Discard patch within 12 hours or as directed by MD 30 patch 0   methocarbamol (ROBAXIN) 500 MG tablet Take 1 tablet (500 mg total) by mouth 3 (three) times daily. 18 tablet 0   naproxen sodium (ANAPROX) 220 MG tablet Take 440 mg by mouth daily as needed (menstrual cycle).     olmesartan (BENICAR) 40 MG tablet Take 40 mg by mouth daily.     Probiotic Product (PROBIOTIC DAILY PO) Take 1 capsule by mouth daily.      vitamin B-12 (CYANOCOBALAMIN) 100 MCG tablet Take 100 mcg by mouth daily.     No current  facility-administered medications for this visit.     ALLERGIES: Lisinopril and Betadine [povidone iodine]  Family History  Problem Relation Age of Onset   Diabetes Father    Cirrhosis Father    Peripheral vascular disease Mother    Multiple sclerosis Sister    Other Other    Diabetes Maternal Uncle    Hypertension Maternal Uncle    Diabetes Paternal Aunt    Hypertension Paternal Aunt    Breast cancer Paternal Aunt 35   Diabetes Maternal Grandmother    Heart disease Maternal Grandmother        CVA   Heart disease Maternal Aunt        CABG    Social History   Socioeconomic History   Marital status: Divorced    Spouse name: Not on file   Number of children: Not on file   Years of education: Not on file   Highest education level: Not on file  Occupational History   Not on file  Tobacco Use   Smoking status: Former    Packs/day: 0.25    Years: 31.00    Pack years: 7.75    Types: Cigarettes    Quit date: 09/24/2009    Years since  quitting: 11.6   Smokeless tobacco: Never  Vaping Use   Vaping Use: Never used  Substance and Sexual Activity   Alcohol use: Yes    Comment: 48 ounces of beer 4 days a week   Drug use: No   Sexual activity: Yes    Partners: Male    Birth control/protection: Surgical    Comment: BTL  Other Topics Concern   Not on file  Social History Narrative   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   Social Determinants of Health   Financial Resource Strain: Not on file  Food Insecurity: Not on file  Transportation Needs: Not on file  Physical Activity: Not on file  Stress: Not on file  Social Connections: Not on file  Intimate Partner Violence: Not on file    ROS  PHYSICAL EXAMINATION:    BP 126/74   Pulse 77   Ht '5\' 4"'$  (1.626 m)   Wt 183 lb (83 kg)   LMP 04/22/2021   SpO2 100%   BMI 31.41 kg/m     General appearance: alert, cooperative and appears stated age Neck: supple, no thyromegaly Heart: regular rate and rhythm Lungs: CTAB Abdomen: soft, non-tender; bowel sounds normal; no masses,  no organomegaly Extremities: normal, atraumatic, no cyanosis Skin: normal color, texture and turgor, no rashes or lesions Lymph: normal cervical supraclavicular and inguinal nodes Neurologic: grossly normal   Pelvic ultrasound  Indications: Intermenstrual bleeding  Findings:  Uterus 11.04 x 6.94 x 5.76 cm Fibroids:  1) 2.74 x 2.24 cm, submucosal  2) 2.32 x 1.33 cm, anterior, intramural  3) 2.21 x 1.77 cm, posterior, intramural  4) 2.59 x 1.8 cm, right, lateral, intramural/subserosal  5) 1.26 x 1.06 cm, intramural  6) 1.53 x 1.07 cm, subserosal  Endometrium 7.27 cm  2.7 cm submucosal myoma  Left ovary 3.72 x 3.46 x 2.4 cm  1.63 x 1.62 cm simple, avascular cyst  Right ovary 3.18 x 1.48 x 1.83 cm  No free fluid   Impression:  Anteverted uterus Intramural, pedunculated and submucosal fibroids Largest fibroid is 2.7 cm and submucosal Normal adnexa bilaterally  1.  Menometrorrhagia Has submucosal myoma  2. Intramural, submucous, and subserous leiomyoma of uterus Plan: hysteroscopy, myomectomy, dilation and curettage, mirena iud insertion.  Reviewed risks, including: bleeding, infection, uterine perforation, fluid overload, need for further sugery   3. Screening examination for STD (sexually transmitted disease) Cervical cultures are pending. - HIV Antibody (routine testing w rflx) - RPR - Hepatitis C antibody  4. Dysmenorrhea  In addition to reviewing the ultrasound images, ~20 minutes was spent in management as above, surgery will be scheduled.

## 2021-05-16 NOTE — Telephone Encounter (Signed)
Surgery: hysteroscopic myomectomy, D&C, Mirena IUD insertion  Diagnosis: Menometrorrhagia, fibroid uterus  Location: [facility]  Status: Outpatient  Time: 108 Minutes  Assistant: N/A  Urgency: At Patient's Convenience  Pre-Op Appointment: Completed  Post-Op Appointment(s): 2 Weeks,   Time Out Of Work: Day Of Surgery, 1 Day Post Op

## 2021-05-17 LAB — TSH: TSH: 0.92 mIU/L

## 2021-05-17 LAB — RPR: RPR Ser Ql: NONREACTIVE

## 2021-05-17 LAB — HEPATITIS C ANTIBODY
Hepatitis C Ab: NONREACTIVE
SIGNAL TO CUT-OFF: 0.06 (ref <1.00)

## 2021-05-17 LAB — HIV ANTIBODY (ROUTINE TESTING W REFLEX): HIV 1&2 Ab, 4th Generation: NONREACTIVE

## 2021-05-22 ENCOUNTER — Ambulatory Visit: Payer: 59

## 2021-05-22 NOTE — Telephone Encounter (Signed)
Spoke with patient. Reviewed surgery dates. Patient request to proceed with surgery on 06/26/21.  Advised patient I will forward to business office for return call. I will return call once surgery date and time confirmed. Patient verbalizes understanding and is agreeable.   Surgery request sent.   Routing to [facility] [employer]

## 2021-05-23 ENCOUNTER — Other Ambulatory Visit: Payer: No Typology Code available for payment source

## 2021-05-30 NOTE — Telephone Encounter (Signed)
Spoke with patient regarding surgery benefits. Patient acknowledges understanding of information presented. Patient is aware that benefits presented are professional benefits only. Patient to contact insurance regarding adding coverage. See account note for additional details. Patient aware will receive return call once updated benefits obtained. Patient agreeable.  Routing to Glorianne Manchester, Therapist, sports.

## 2021-06-01 NOTE — Telephone Encounter (Signed)
Call to patient, no answer, voicemail not available. SMS notification option available, SMS sent.   MyChart Message to patient.

## 2021-06-01 NOTE — Telephone Encounter (Signed)
Spoke with patient. Surgery date request confirmed.  Advised surgery is scheduled for 06/26/21, [facility] at 10am.  Surgery instruction sheet and hospital brochure reviewed, printed copy will be mailed.  Patient advised if Covid screening and quarantine requirements and agreeable.   Routing to provider. Encounter closed.  Cc: Hayley Carder

## 2021-06-01 NOTE — Telephone Encounter (Signed)
Call to patient, no answer, voicemail not available. SMS notification option available, SMS sent.

## 2021-06-13 ENCOUNTER — Other Ambulatory Visit: Payer: Self-pay

## 2021-06-13 ENCOUNTER — Ambulatory Visit
Admission: RE | Admit: 2021-06-13 | Discharge: 2021-06-13 | Disposition: A | Discharge status: Home / Self Care | Payer: No Typology Code available for payment source | Source: Ambulatory Visit | Attending: Family Medicine | Admitting: Family Medicine

## 2021-06-13 DIAGNOSIS — Z1231 Encounter for screening mammogram for malignant neoplasm of breast: Secondary | ICD-10-CM

## 2021-06-19 ENCOUNTER — Other Ambulatory Visit: Payer: Self-pay

## 2021-06-19 ENCOUNTER — Ambulatory Visit (INDEPENDENT_AMBULATORY_CARE_PROVIDER_SITE_OTHER): Payer: No Typology Code available for payment source | Admitting: Obstetrics and Gynecology

## 2021-06-19 ENCOUNTER — Encounter: Payer: Self-pay | Admitting: Obstetrics and Gynecology

## 2021-06-19 VITALS — BP 110/80 | HR 88 | Ht 64.0 in | Wt 179.0 lb

## 2021-06-19 DIAGNOSIS — D252 Subserosal leiomyoma of uterus: Secondary | ICD-10-CM | POA: Diagnosis not present

## 2021-06-19 DIAGNOSIS — D250 Submucous leiomyoma of uterus: Secondary | ICD-10-CM

## 2021-06-19 DIAGNOSIS — N921 Excessive and frequent menstruation with irregular cycle: Secondary | ICD-10-CM | POA: Diagnosis not present

## 2021-06-19 DIAGNOSIS — D251 Intramural leiomyoma of uterus: Secondary | ICD-10-CM

## 2021-06-19 NOTE — H&P (View-Only) (Signed)
GYNECOLOGY  VISIT   HPI: 55 y.o.   Divorced Black or [place] American Not Hispanic or Latino  female   [phone] with Patient's last menstrual period was 05/22/2021.   here for a preoperative visit.  She is scheduled for a hysteroscopic myomectomy next week.   She presented with menometrorrhagia.   Pap normal  Ultrasound from 05/16/21: Findings:   Uterus 11.04 x 6.94 x 5.76 cm Fibroids:             1) 2.74 x 2.24 cm, submucosal (type 0 or 1)             2) 2.32 x 1.33 cm, anterior, intramural             3) 2.21 x 1.77 cm, posterior, intramural             4) 2.59 x 1.8 cm, right, lateral, intramural/subserosal             5) 1.26 x 1.06 cm, intramural             6) 1.53 x 1.07 cm, subserosal   Endometrium 7.27 cm             2.7 cm submucosal myoma   Left ovary 3.72 x 3.46 x 2.4 cm             1.63 x 1.62 cm simple, avascular cyst   Right ovary 3.18 x 1.48 x 1.83 cm   No free fluid     Impression:  Anteverted uterus Intramural, pedunculated and submucosal fibroids Largest fibroid is 2.7 cm and submucosal Normal adnexa bilaterally   Recently started on medication for type 2 DM. HgbA1C was 7.3% in August, 2022  GYNECOLOGIC HISTORY: Patient's last menstrual period was 05/22/2021. 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   Substance abuse ([place])    trichomonas 12/2018  treated   Type 2 diabetes mellitus ([place])    Vulvar vestibulitis 2005    Past Surgical History:  Procedure Laterality Date   BUNIONECTOMY Bilateral    CESAREAN SECTION     X 2   CHOLECYSTECTOMY, LAPAROSCOPIC  11/2009   INDUCED ABORTION     LAPAROSCOPY N/A 06/16/2013   Procedure: LAPAROSCOPY DIAGNOSTIC;  Surgeon: Lyman Speller, MD;  Location: [facility];  Service: Gynecology;  Laterality: N/A;   TUBAL LIGATION  2000    Current Outpatient Medications  Medication Sig Dispense Refill   atenolol (TENORMIN) 50 MG tablet Take 50 mg by mouth daily.     betamethasone valerate ointment (VALISONE) 0.1 % Use a  pea sized amount topically BID for 1-2 weeks as needed (Patient taking differently: Apply 1 application topically 2 (two) times daily as needed (rash).) 15 g 0   Cholecalciferol (VITAMIN D3) 125 MCG (5000 UT) CAPS Take 1 capsule by mouth daily.     empagliflozin (JARDIANCE) 10 MG TABS tablet Take 10 mg by mouth daily.     imiquimod (ALDARA) 5 % cream Apply topically 3 (three) times a week. Apply entire packet of cream to affected areas 3 times weekly.  Apply at night and leave on during sleeping hours.  Wash off completely after 8 hours. 12 each 2   indapamide (LOZOL) 2.5 MG tablet Take 2.5 mg by mouth daily.     naproxen sodium (ANAPROX) 220 MG tablet Take 440 mg by mouth daily as needed (menstrual cycle).     olmesartan (BENICAR) 40 MG tablet Take 40 mg by mouth daily.     Probiotic Product (PROBIOTIC DAILY PO) Take 1 capsule by mouth daily.      vitamin B-12 (CYANOCOBALAMIN) 100 MCG tablet Take 100 mcg by mouth daily.     No current facility-administered medications for this visit.     ALLERGIES: Lisinopril and Betadine [povidone iodine]  Family History  Problem Relation Age of Onset   Diabetes Father    Cirrhosis Father    Peripheral vascular disease Mother    Multiple sclerosis Sister    Other Other    Diabetes Maternal Uncle    Hypertension Maternal Uncle    Diabetes  Paternal Aunt    Hypertension Paternal Aunt    Breast cancer Paternal Aunt 42   Diabetes Maternal Grandmother    Heart disease Maternal Grandmother        CVA   Heart disease Maternal Aunt        CABG    Social History   Socioeconomic History   Marital status: Divorced    Spouse name: Not on file   Number of children: Not on file   Years of education: Not on file   Highest education level: Not on file  Occupational History   Not on file  Tobacco Use   Smoking status: Former    Packs/day: 0.25    Years: 31.00    Pack years: 7.75    Types: Cigarettes    Quit date: 09/24/2009    Years since quitting: 11.7   Smokeless tobacco: Never  Vaping Use   Vaping Use: Never used  Substance and Sexual Activity   Alcohol use: Yes    Comment: 48 ounces of beer 4 days a week   Drug use: No   Sexual activity: Yes    Partners: Male    Birth control/protection: Surgical    Comment: BTL  Other Topics Concern   Not on file  Social History Narrative   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   Social Determinants of Health   Financial Resource Strain: Not on file  Food Insecurity: Not on file  Transportation Needs: Not on file  Physical Activity: Not on file  Stress: Not on file  Social Connections: Not on file  Intimate Partner Violence: Not on file    Review of Systems  Genitourinary:        Vaginal bleeding    PHYSICAL EXAMINATION:    BP 110/80   Pulse 88   Ht 5\' 4"  (1.626 m)   Wt 179 lb (81.2 kg)   LMP 05/22/2021   SpO2 100%   BMI 30.73 kg/m  General appearance: alert, cooperative and appears stated age Neck: no adenopathy, supple, symmetrical, trachea midline and thyroid normal to inspection and palpation Heart: regular rate and rhythm Lungs: CTAB Abdomen: soft, non-tender; bowel sounds normal; no masses,  no organomegaly Extremities: normal, atraumatic, no cyanosis Skin: normal color, texture and turgor, no rashes or lesions Lymph: normal cervical  supraclavicular and inguinal nodes Neurologic: grossly normal   1. Menometrorrhagia Plan: hysteroscopy, myomectomy, dilation and curettage, mirena IUD insertion. Reviewed risks, including: bleeding, infection, uterine perforation, fluid overload, need for further sugery   2. Intramural, submucous, and subserous leiomyoma of uterus See above

## 2021-06-19 NOTE — Progress Notes (Signed)
GYNECOLOGY  VISIT   HPI: 55 y.o.   Divorced Black or [place] American Not Hispanic or Latino  female   [phone] with Patient's last menstrual period was 05/22/2021.   here for a preoperative visit.  She is scheduled for a hysteroscopic myomectomy next week.   She presented with menometrorrhagia.   Pap normal  Ultrasound from 05/16/21: Findings:   Uterus 11.04 x 6.94 x 5.76 cm Fibroids:             1) 2.74 x 2.24 cm, submucosal (type 0 or 1)             2) 2.32 x 1.33 cm, anterior, intramural             3) 2.21 x 1.77 cm, posterior, intramural             4) 2.59 x 1.8 cm, right, lateral, intramural/subserosal             5) 1.26 x 1.06 cm, intramural             6) 1.53 x 1.07 cm, subserosal   Endometrium 7.27 cm             2.7 cm submucosal myoma   Left ovary 3.72 x 3.46 x 2.4 cm             1.63 x 1.62 cm simple, avascular cyst   Right ovary 3.18 x 1.48 x 1.83 cm   No free fluid     Impression:  Anteverted uterus Intramural, pedunculated and submucosal fibroids Largest fibroid is 2.7 cm and submucosal Normal adnexa bilaterally   Recently started on medication for type 2 DM. HgbA1C was 7.3% in August, 2022  GYNECOLOGIC HISTORY: Patient's last menstrual period was 05/22/2021. 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   Substance abuse ([place])    trichomonas 12/2018  treated   Type 2 diabetes mellitus ([place])    Vulvar vestibulitis 2005    Past Surgical History:  Procedure Laterality Date   BUNIONECTOMY Bilateral    CESAREAN SECTION     X 2   CHOLECYSTECTOMY, LAPAROSCOPIC  11/2009   INDUCED ABORTION     LAPAROSCOPY N/A 06/16/2013   Procedure: LAPAROSCOPY DIAGNOSTIC;  Surgeon: Lyman Speller, MD;  Location: [facility];  Service: Gynecology;  Laterality: N/A;   TUBAL LIGATION  2000    Current Outpatient Medications  Medication Sig Dispense Refill   atenolol (TENORMIN) 50 MG tablet Take 50 mg by mouth daily.     betamethasone valerate ointment (VALISONE) 0.1 % Use a  pea sized amount topically BID for 1-2 weeks as needed (Patient taking differently: Apply 1 application topically 2 (two) times daily as needed (rash).) 15 g 0   Cholecalciferol (VITAMIN D3) 125 MCG (5000 UT) CAPS Take 1 capsule by mouth daily.     empagliflozin (JARDIANCE) 10 MG TABS tablet Take 10 mg by mouth daily.     imiquimod (ALDARA) 5 % cream Apply topically 3 (three) times a week. Apply entire packet of cream to affected areas 3 times weekly.  Apply at night and leave on during sleeping hours.  Wash off completely after 8 hours. 12 each 2   indapamide (LOZOL) 2.5 MG tablet Take 2.5 mg by mouth daily.     naproxen sodium (ANAPROX) 220 MG tablet Take 440 mg by mouth daily as needed (menstrual cycle).     olmesartan (BENICAR) 40 MG tablet Take 40 mg by mouth daily.     Probiotic Product (PROBIOTIC DAILY PO) Take 1 capsule by mouth daily.      vitamin B-12 (CYANOCOBALAMIN) 100 MCG tablet Take 100 mcg by mouth daily.     No current facility-administered medications for this visit.     ALLERGIES: Lisinopril and Betadine [povidone iodine]  Family History  Problem Relation Age of Onset   Diabetes Father    Cirrhosis Father    Peripheral vascular disease Mother    Multiple sclerosis Sister    Other Other    Diabetes Maternal Uncle    Hypertension Maternal Uncle    Diabetes  Paternal Aunt    Hypertension Paternal Aunt    Breast cancer Paternal Aunt 38   Diabetes Maternal Grandmother    Heart disease Maternal Grandmother        CVA   Heart disease Maternal Aunt        CABG    Social History   Socioeconomic History   Marital status: Divorced    Spouse name: Not on file   Number of children: Not on file   Years of education: Not on file   Highest education level: Not on file  Occupational History   Not on file  Tobacco Use   Smoking status: Former    Packs/day: 0.25    Years: 31.00    Pack years: 7.75    Types: Cigarettes    Quit date: 09/24/2009    Years since quitting: 11.7   Smokeless tobacco: Never  Vaping Use   Vaping Use: Never used  Substance and Sexual Activity   Alcohol use: Yes    Comment: 48 ounces of beer 4 days a week   Drug use: No   Sexual activity: Yes    Partners: Male    Birth control/protection: Surgical    Comment: BTL  Other Topics Concern   Not on file  Social History Narrative   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   Social Determinants of Health   Financial Resource Strain: Not on file  Food Insecurity: Not on file  Transportation Needs: Not on file  Physical Activity: Not on file  Stress: Not on file  Social Connections: Not on file  Intimate Partner Violence: Not on file    Review of Systems  Genitourinary:        Vaginal bleeding    PHYSICAL EXAMINATION:    BP 110/80   Pulse 88   Ht 5\' 4"  (1.626 m)   Wt 179 lb (81.2 kg)   LMP 05/22/2021   SpO2 100%   BMI 30.73 kg/m  General appearance: alert, cooperative and appears stated age Neck: no adenopathy, supple, symmetrical, trachea midline and thyroid normal to inspection and palpation Heart: regular rate and rhythm Lungs: CTAB Abdomen: soft, non-tender; bowel sounds normal; no masses,  no organomegaly Extremities: normal, atraumatic, no cyanosis Skin: normal color, texture and turgor, no rashes or lesions Lymph: normal cervical  supraclavicular and inguinal nodes Neurologic: grossly normal   1. Menometrorrhagia Plan: hysteroscopy, myomectomy, dilation and curettage, mirena IUD insertion. Reviewed risks, including: bleeding, infection, uterine perforation, fluid overload, need for further sugery   2. Intramural, submucous, and subserous leiomyoma of uterus See above

## 2021-06-20 ENCOUNTER — Other Ambulatory Visit: Payer: Self-pay

## 2021-06-20 ENCOUNTER — Encounter (HOSPITAL_BASED_OUTPATIENT_CLINIC_OR_DEPARTMENT_OTHER): Payer: Self-pay | Admitting: Obstetrics and Gynecology

## 2021-06-20 NOTE — Progress Notes (Addendum)
Spoke w/ via phone for pre-op interview---patient Lab needs dos---- urine POCT              Lab results------06/22/21 lab appt for CBC, CMP, EKG COVID test -----patient states asymptomatic no test needed Arrive at -------0800 NPO after MN NO Solid Food.  Clear liquids from MN until---0700 Med rec completed Medications to take morning of surgery -----atenolol Diabetic medication -----Jardiance- Hold morning of surgery Patient instructed no nail polish to be worn day of surgery Patient instructed to bring photo id and insurance card day of surgery Patient aware to have Driver (ride ) / caregiver    for 24 hours after surgery  - boyfriend Wilfred Lacy Patient Special Instructions -----n/a Pre-Op special Istructions -----n/a Patient verbalized understanding of instructions that were given at this phone interview. Patient denies shortness of breath, chest pain, fever, cough at this phone interview.

## 2021-06-22 ENCOUNTER — Telehealth: Payer: Self-pay | Admitting: *Deleted

## 2021-06-22 ENCOUNTER — Encounter (HOSPITAL_COMMUNITY)
Admission: RE | Admit: 2021-06-22 | Discharge: 2021-06-22 | Disposition: A | Discharge status: Home / Self Care | Payer: No Typology Code available for payment source | Source: Ambulatory Visit | Attending: Obstetrics and Gynecology | Admitting: Obstetrics and Gynecology

## 2021-06-22 DIAGNOSIS — Z01818 Encounter for other preprocedural examination: Secondary | ICD-10-CM | POA: Diagnosis present

## 2021-06-22 LAB — CBC
HCT: 42 % (ref 36.0–46.0)
Hemoglobin: 14.1 g/dL (ref 12.0–15.0)
MCH: 30.2 pg (ref 26.0–34.0)
MCHC: 33.6 g/dL (ref 30.0–36.0)
MCV: 89.9 fL (ref 80.0–100.0)
Platelets: 262 10*3/uL (ref 150–400)
RBC: 4.67 MIL/uL (ref 3.87–5.11)
RDW: 17.5 % — ABNORMAL HIGH (ref 11.5–15.5)
WBC: 6.3 10*3/uL (ref 4.0–10.5)
nRBC: 0 % (ref 0.0–0.2)

## 2021-06-22 LAB — COMPREHENSIVE METABOLIC PANEL
ALT: 31 U/L (ref 0–44)
AST: 28 U/L (ref 15–41)
Albumin: 4 g/dL (ref 3.5–5.0)
Alkaline Phosphatase: 46 U/L (ref 38–126)
Anion gap: 9 (ref 5–15)
BUN: 12 mg/dL (ref 6–20)
CO2: 29 mmol/L (ref 22–32)
Calcium: 9.8 mg/dL (ref 8.9–10.3)
Chloride: 100 mmol/L (ref 98–111)
Creatinine, Ser: 0.81 mg/dL (ref 0.44–1.00)
GFR, Estimated: >60 mL/min (ref >60)
Glucose, Bld: 149 mg/dL — ABNORMAL HIGH (ref 70–99)
Potassium: 3.1 mmol/L — ABNORMAL LOW (ref 3.5–5.1)
Sodium: 138 mmol/L (ref 135–145)
Total Bilirubin: 0.6 mg/dL (ref 0.3–1.2)
Total Protein: 7.5 g/dL (ref 6.5–8.1)

## 2021-06-22 NOTE — Telephone Encounter (Signed)
Call placed to [facility] at [place]/ Dr. Stephanie Acre.   Spoke with Manuela Schwartz, advised as seen below per Dr. Talbert Nan. Message to be forwarded to Dr. Stephanie Acre. This RN requested return call with f/u once reviewed.

## 2021-06-22 NOTE — Telephone Encounter (Signed)
-----   Message from Salvadore Dom, MD sent at 06/22/2021  1:20 PM EDT ----- Please reach out to her primary MD, she needs correction of her potassium. She has surgery scheduled for 06/26/21.

## 2021-06-23 NOTE — Telephone Encounter (Signed)
Call returned to [facility] at [place], spoke with Kayla Rojas. Following up on message from 06/22/21. Patient is scheduled for surgery on 06/26/21. Was advised will review with Dr. Stephanie Acre and return call.    Call to patient to provide update. Patient states Kayla Rojas from [place] called to advise a new Rx for K+ has been sent to her pharmacy. Patient states she was advised by PCP to start Rx K+ today, surgery will need to cancelled and rescheduled once K+ corrected. Advised patient awaiting return call from PCP, follow recommendations per PCP. Will also review with covering provider. I will return call today to confirm. Patient verbalizes understanding and is agreeable.    Routing to covering provider, Dr. Quincy Simmonds.   Cc: Dr. Talbert Nan

## 2021-06-23 NOTE — Telephone Encounter (Signed)
Recommendations from Dr. Stephanie Acre received for potassium replacement.  She has prescribed K 10 Meq, take 2 tablets twice a day for one day, then take 1 tablet by mouth daily for 30 days.  Will need basic metabolic profile the morning of surgery.   Dr. Myer Peer note is scanned into Epic under "procedures."

## 2021-06-23 NOTE — Telephone Encounter (Signed)
Call placed to [facility] at [place].  Spoke with BJ.  Was advised recommendations per Dr. Stephanie Acre will be faxed to [phone].   Fax recommendations to Dr. Quincy Simmonds.

## 2021-06-23 NOTE — Telephone Encounter (Signed)
Communicated with [place] PAT. Confirmed BMP can be drawn pre-op upon arrival. Dr. Talbert Nan will need to enter order. Langley Gauss will let pre-op know BMP needed.    Call placed to patient. Advised as seen above. Will proceed with surgery as scheduled. Patient states she has picked up Rx and medication started. Patient verbalizes understanding and is agreeable.   Dr. Talbert Nan -please enter pre-op BMP.   Cc: Dr. Quincy Simmonds

## 2021-06-26 ENCOUNTER — Ambulatory Visit (HOSPITAL_BASED_OUTPATIENT_CLINIC_OR_DEPARTMENT_OTHER): Payer: No Typology Code available for payment source | Admitting: Anesthesiology

## 2021-06-26 ENCOUNTER — Ambulatory Visit (HOSPITAL_BASED_OUTPATIENT_CLINIC_OR_DEPARTMENT_OTHER)
Admission: RE | Admit: 2021-06-26 | Discharge: 2021-06-26 | Disposition: A | Discharge status: Home / Self Care | Payer: No Typology Code available for payment source | Source: Ambulatory Visit | Attending: Obstetrics and Gynecology | Admitting: Obstetrics and Gynecology

## 2021-06-26 ENCOUNTER — Other Ambulatory Visit: Payer: Self-pay

## 2021-06-26 ENCOUNTER — Encounter (HOSPITAL_BASED_OUTPATIENT_CLINIC_OR_DEPARTMENT_OTHER)
Admission: RE | Disposition: A | Discharge status: Home / Self Care | Payer: Self-pay | Source: Ambulatory Visit | Attending: Obstetrics and Gynecology

## 2021-06-26 ENCOUNTER — Encounter (HOSPITAL_BASED_OUTPATIENT_CLINIC_OR_DEPARTMENT_OTHER): Payer: Self-pay | Admitting: Obstetrics and Gynecology

## 2021-06-26 DIAGNOSIS — Z820 Family history of epilepsy and other diseases of the nervous system: Secondary | ICD-10-CM | POA: Diagnosis not present

## 2021-06-26 DIAGNOSIS — D251 Intramural leiomyoma of uterus: Secondary | ICD-10-CM | POA: Diagnosis not present

## 2021-06-26 DIAGNOSIS — Z79899 Other long term (current) drug therapy: Secondary | ICD-10-CM | POA: Insufficient documentation

## 2021-06-26 DIAGNOSIS — D252 Subserosal leiomyoma of uterus: Secondary | ICD-10-CM | POA: Insufficient documentation

## 2021-06-26 DIAGNOSIS — N921 Excessive and frequent menstruation with irregular cycle: Secondary | ICD-10-CM | POA: Insufficient documentation

## 2021-06-26 DIAGNOSIS — Z833 Family history of diabetes mellitus: Secondary | ICD-10-CM | POA: Diagnosis not present

## 2021-06-26 DIAGNOSIS — N840 Polyp of corpus uteri: Secondary | ICD-10-CM | POA: Diagnosis not present

## 2021-06-26 DIAGNOSIS — Z87891 Personal history of nicotine dependence: Secondary | ICD-10-CM | POA: Insufficient documentation

## 2021-06-26 DIAGNOSIS — Z7984 Long term (current) use of oral hypoglycemic drugs: Secondary | ICD-10-CM | POA: Insufficient documentation

## 2021-06-26 DIAGNOSIS — Z803 Family history of malignant neoplasm of breast: Secondary | ICD-10-CM | POA: Diagnosis not present

## 2021-06-26 DIAGNOSIS — N854 Malposition of uterus: Secondary | ICD-10-CM | POA: Diagnosis not present

## 2021-06-26 DIAGNOSIS — D250 Submucous leiomyoma of uterus: Secondary | ICD-10-CM | POA: Diagnosis not present

## 2021-06-26 DIAGNOSIS — E1140 Type 2 diabetes mellitus with diabetic neuropathy, unspecified: Secondary | ICD-10-CM | POA: Diagnosis not present

## 2021-06-26 DIAGNOSIS — Z888 Allergy status to other drugs, medicaments and biological substances status: Secondary | ICD-10-CM | POA: Insufficient documentation

## 2021-06-26 DIAGNOSIS — Z8379 Family history of other diseases of the digestive system: Secondary | ICD-10-CM | POA: Diagnosis not present

## 2021-06-26 DIAGNOSIS — Z8249 Family history of ischemic heart disease and other diseases of the circulatory system: Secondary | ICD-10-CM | POA: Diagnosis not present

## 2021-06-26 HISTORY — PX: INTRAUTERINE DEVICE (IUD) INSERTION: SHX5877

## 2021-06-26 HISTORY — PX: HYSTEROSCOPY: SHX211

## 2021-06-26 LAB — POCT I-STAT, CHEM 8
BUN: 16 mg/dL (ref 6–20)
Calcium, Ion: 1.28 mmol/L (ref 1.15–1.40)
Chloride: 97 mmol/L — ABNORMAL LOW (ref 98–111)
Creatinine, Ser: 0.7 mg/dL (ref 0.44–1.00)
Glucose, Bld: 173 mg/dL — ABNORMAL HIGH (ref 70–99)
HCT: 44 % (ref 36.0–46.0)
Hemoglobin: 15 g/dL (ref 12.0–15.0)
Potassium: 3.1 mmol/L — ABNORMAL LOW (ref 3.5–5.1)
Sodium: 140 mmol/L (ref 135–145)
TCO2: 31 mmol/L (ref 22–32)

## 2021-06-26 LAB — POCT PREGNANCY, URINE: Preg Test, Ur: NEGATIVE

## 2021-06-26 LAB — GLUCOSE, CAPILLARY: Glucose-Capillary: 155 mg/dL — ABNORMAL HIGH (ref 70–99)

## 2021-06-26 SURGERY — HYSTEROSCOPY
Anesthesia: General

## 2021-06-26 MED ORDER — PROPOFOL 10 MG/ML IV BOLUS
INTRAVENOUS | Status: AC
  Filled 2021-06-26: qty 20

## 2021-06-26 MED ORDER — SODIUM CHLORIDE (PF) 0.9 % IJ SOLN
INTRAMUSCULAR | Status: DC | PRN
  Administered 2021-06-26: 19.9 mL

## 2021-06-26 MED ORDER — OXYCODONE HCL 5 MG PO TABS: 5.0000 mg | ORAL_TABLET | Freq: Once | ORAL | Status: DC | PRN

## 2021-06-26 MED ORDER — OXYCODONE HCL 5 MG/5ML PO SOLN
5.0000 mg | Freq: Once | ORAL | Status: DC | PRN
Start: 2021-06-26 — End: 2021-06-26

## 2021-06-26 MED ORDER — VASOPRESSIN 20 UNIT/ML IV SOLN
INTRAVENOUS | Status: DC | PRN
  Administered 2021-06-26: .1 [IU]

## 2021-06-26 MED ORDER — MIDAZOLAM HCL 2 MG/2ML IJ SOLN
INTRAMUSCULAR | Status: DC | PRN
  Administered 2021-06-26: 2 mg via INTRAVENOUS

## 2021-06-26 MED ORDER — KETOROLAC TROMETHAMINE 30 MG/ML IJ SOLN
INTRAMUSCULAR | Status: DC | PRN
  Administered 2021-06-26: 30 mg via INTRAVENOUS

## 2021-06-26 MED ORDER — ONDANSETRON HCL 4 MG/2ML IJ SOLN
INTRAMUSCULAR | Status: DC | PRN
Start: 2021-06-26 — End: 2021-06-26
  Administered 2021-06-26: 4 mg via INTRAVENOUS

## 2021-06-26 MED ORDER — FENTANYL CITRATE (PF) 100 MCG/2ML IJ SOLN
INTRAMUSCULAR | Status: AC
  Filled 2021-06-26: qty 2

## 2021-06-26 MED ORDER — LACTATED RINGERS IV SOLN: INTRAVENOUS | Status: DC

## 2021-06-26 MED ORDER — MIDAZOLAM HCL 2 MG/2ML IJ SOLN
INTRAMUSCULAR | Status: AC
  Filled 2021-06-26: qty 2

## 2021-06-26 MED ORDER — LEVONORGESTREL 20 MCG/DAY IU IUD
1.0000 | INTRAUTERINE_SYSTEM | INTRAUTERINE | Status: AC
  Administered 2021-06-26: 1 via INTRAUTERINE

## 2021-06-26 MED ORDER — ACETAMINOPHEN 500 MG PO TABS
1000.0000 mg | ORAL_TABLET | ORAL | Status: AC
  Administered 2021-06-26: 1000 mg via ORAL

## 2021-06-26 MED ORDER — LIDOCAINE 2% (20 MG/ML) 5 ML SYRINGE
INTRAMUSCULAR | Status: DC | PRN
  Administered 2021-06-26: 60 mg via INTRAVENOUS

## 2021-06-26 MED ORDER — LIDOCAINE 2% (20 MG/ML) 5 ML SYRINGE
INTRAMUSCULAR | Status: AC
  Filled 2021-06-26: qty 5

## 2021-06-26 MED ORDER — LEVONORGESTREL 20 MCG/DAY IU IUD
INTRAUTERINE_SYSTEM | INTRAUTERINE | Status: AC
  Filled 2021-06-26: qty 1

## 2021-06-26 MED ORDER — ACETAMINOPHEN 325 MG PO TABS: 325.0000 mg | ORAL_TABLET | ORAL | Status: DC | PRN

## 2021-06-26 MED ORDER — ACETAMINOPHEN 500 MG PO TABS
ORAL_TABLET | ORAL | Status: AC
  Filled 2021-06-26: qty 2

## 2021-06-26 MED ORDER — DEXAMETHASONE SODIUM PHOSPHATE 10 MG/ML IJ SOLN
INTRAMUSCULAR | Status: DC | PRN
  Administered 2021-06-26 (×2): 5 mg via INTRAVENOUS

## 2021-06-26 MED ORDER — ONDANSETRON HCL 4 MG/2ML IJ SOLN
4.0000 mg | Freq: Once | INTRAMUSCULAR | Status: DC | PRN
Start: 2021-06-26 — End: 2021-06-26

## 2021-06-26 MED ORDER — FENTANYL CITRATE (PF) 100 MCG/2ML IJ SOLN
25.0000 ug | INTRAMUSCULAR | Status: DC | PRN
  Administered 2021-06-26: 25 ug via INTRAVENOUS

## 2021-06-26 MED ORDER — MEPERIDINE HCL 25 MG/ML IJ SOLN: 6.2500 mg | INTRAMUSCULAR | Status: DC | PRN

## 2021-06-26 MED ORDER — PROPOFOL 10 MG/ML IV BOLUS
INTRAVENOUS | Status: DC | PRN
  Administered 2021-06-26: 150 mg via INTRAVENOUS

## 2021-06-26 MED ORDER — FENTANYL CITRATE (PF) 100 MCG/2ML IJ SOLN
INTRAMUSCULAR | Status: DC | PRN
  Administered 2021-06-26: 50 ug via INTRAVENOUS
  Administered 2021-06-26 (×2): 25 ug via INTRAVENOUS

## 2021-06-26 MED ORDER — ACETAMINOPHEN 160 MG/5ML PO SOLN: 325.0000 mg | ORAL | Status: DC | PRN

## 2021-06-26 MED ORDER — SODIUM CHLORIDE 0.9 % IV SOLN
INTRAVENOUS | Status: AC | PRN
  Administered 2021-06-26: 6000 mL via INTRAMUSCULAR

## 2021-06-26 SURGICAL SUPPLY — 16 items
DRSG TELFA 3X8 NADH (GAUZE/BANDAGES/DRESSINGS) ×2 IMPLANT
GAUZE 4X4 16PLY ~~LOC~~+RFID DBL (SPONGE) ×4 IMPLANT
GLOVE SURG ENC MOIS LTX SZ6.5 (GLOVE) ×2 IMPLANT
GLOVE SURG LTX SZ6.5 (GLOVE) ×1 IMPLANT
GLOVE SURG UNDER POLY LF SZ7 (GLOVE) ×2 IMPLANT
GOWN STRL REUS W/TWL LRG LVL3 (GOWN DISPOSABLE) ×3 IMPLANT
IV NS IRRIG 3000ML ARTHROMATIC (IV SOLUTION) ×2 IMPLANT
KIT PROCEDURE FLUENT (KITS) ×2 IMPLANT
KIT TURNOVER CYSTO (KITS) ×2 IMPLANT
MYOSURE XL FIBROID (MISCELLANEOUS) ×2
PACK VAGINAL MINOR WOMEN LF (CUSTOM PROCEDURE TRAY) ×2 IMPLANT
PAD DRESSING TELFA 3X8 NADH (GAUZE/BANDAGES/DRESSINGS) IMPLANT
PAD OB MATERNITY 4.3X12.25 (PERSONAL CARE ITEMS) ×2 IMPLANT
PAD PREP 24X48 CUFFED NSTRL (MISCELLANEOUS) ×2 IMPLANT
SYSTEM TISS REMOVAL MYOSURE XL (MISCELLANEOUS) IMPLANT
TOWEL OR 17X26 10 PK STRL BLUE (TOWEL DISPOSABLE) ×3 IMPLANT

## 2021-06-26 NOTE — Discharge Instructions (Addendum)
No acetaminophen/Tylenol until after 2:30pm today if needed.    DISCHARGE INSTRUCTIONS: HYSTEROSCOPY  The following instructions have been prepared to help you care for yourself upon your return home.    May take Ibuprofen after 5:00pm today if needed.  May take stool softner while taking narcotic pain medication to prevent constipation.  Drink plenty of water.  Personal hygiene:  Use sanitary pads for vaginal drainage, not tampons.  Shower the day after your procedure.  NO tub baths, pools or Jacuzzis for 2-3 weeks.  Wipe front to back after using the bathroom.  Activity and limitations:  Do NOT drive or operate any equipment for 24 hours. The effects of anesthesia are still present and drowsiness may result.  Do NOT rest in bed all day.  Walking is encouraged.  Walk up and down stairs slowly.  You may resume your normal activity in one to two days or as indicated by your physician. Sexual activity: NO intercourse for at least 2 weeks after the procedure, or as indicated by your Doctor.  Diet: Eat a light meal as desired this evening. You may resume your usual diet tomorrow.  Return to Work: You may resume your work activities in one to two days or as indicated by Marine scientist.  What to expect after your surgery: Expect to have vaginal bleeding/discharge for 2-3 days and spotting for up to 10 days. It is not unusual to have soreness for up to 1-2 weeks. You may have a slight burning sensation when you urinate for the first day. Mild cramps may continue for a couple of days. You may have a regular period in 2-6 weeks.  Call your doctor for any of the following:  Excessive vaginal bleeding or clotting, saturating and changing one pad every hour.  Inability to urinate 6 hours after discharge from hospital.  Pain not relieved by pain medication.  Fever of 100.4 F or greater.  Unusual vaginal discharge or odor.  Post Anesthesia Home Care Instructions  Activity: Get plenty  of rest for the remainder of the day. A responsible individual must stay with you for 24 hours following the procedure.  For the next 24 hours, DO NOT: -Drive a car -Paediatric nurse -Drink alcoholic beverages -Take any medication unless instructed by your physician -Make any legal decisions or sign important papers.  Meals: Start with liquid foods such as gelatin or soup. Progress to regular foods as tolerated. Avoid greasy, spicy, heavy foods. If nausea and/or vomiting occur, drink only clear liquids until the nausea and/or vomiting subsides. Call your physician if vomiting continues.  Special Instructions/Symptoms: Your throat may feel dry or sore from the anesthesia or the breathing tube placed in your throat during surgery. If this causes discomfort, gargle with warm salt water. The discomfort should disappear within 24 hours.

## 2021-06-26 NOTE — Anesthesia Preprocedure Evaluation (Addendum)
Anesthesia Evaluation  Patient identified by MRN, date of birth, ID band Patient awake    Reviewed: Allergy & Precautions, H&P , Patient's Chart, lab work & pertinent test results, reviewed documented beta blocker date and time   History of Anesthesia Complications Negative for: history of anesthetic complications  Airway Mallampati: I  TM Distance: >3 FB Neck ROM: full    Dental no notable dental hx.    Pulmonary Current Smoker and Patient abstained from smoking.,    Pulmonary exam normal breath sounds clear to auscultation       Cardiovascular Exercise Tolerance: Good hypertension, Pt. on home beta blockers  Rhythm:regular Rate:Normal     Neuro/Psych  Neuromuscular disease negative psych ROS   GI/Hepatic negative GI ROS, Neg liver ROS,   Endo/Other  diabetes, Well Controlled, Type 2  Renal/GU negative Renal ROS     Musculoskeletal   Abdominal   Peds  Hematology negative hematology ROS (+)   Anesthesia Other Findings   Reproductive/Obstetrics negative OB ROS                            Anesthesia Physical  Anesthesia Plan  ASA: 3  Anesthesia Plan: General   Post-op Pain Management:    Induction: Intravenous  PONV Risk Score and Plan: 2 and Ondansetron, Treatment may vary due to age or medical condition and Midazolam  Airway Management Planned: LMA and Oral ETT  Additional Equipment: None  Intra-op Plan:   Post-operative Plan: Extubation in OR  Informed Consent: I have reviewed the patients History and Physical, chart, labs and discussed the procedure including the risks, benefits and alternatives for the proposed anesthesia with the patient or authorized representative who has indicated his/her understanding and acceptance.     Dental Advisory Given  Plan Discussed with: CRNA and Anesthesiologist  Anesthesia Plan Comments: (  )        Anesthesia Quick  Evaluation

## 2021-06-26 NOTE — Interval H&P Note (Signed)
History and Physical Interval Note:  06/26/2021 10:00 AM  Kayla Rojas  has presented today for surgery, with the diagnosis of menometrorrhagia, fibroid uterus.  The various methods of treatment have been discussed with the patient and family. After consideration of risks, benefits and other options for treatment, the patient has consented to  Procedure(s): Hysteroscopic myomectomy, D&C (N/A) Mirena INTRAUTERINE DEVICE (IUD) INSERTION (N/A) as a surgical intervention.  The patient's history has been reviewed, patient examined, no change in status, stable for surgery.  I have reviewed the patient's chart and labs.  Questions were answered to the patient's satisfaction.     Salvadore Dom

## 2021-06-26 NOTE — Op Note (Signed)
Preoperative Diagnosis: menometrorrhagia, fibroid uterus  Postoperative Diagnosis: same  Procedure: Hysteroscopy, myomectomy, dilation and curettage, mirena intrauterine device insertion  Surgeon: Dr Sumner Boast  Assistants: None  Anesthesia: General via LMA  EBL: 5 cc  Fluids: 400 cc LR  Fluid deficit: 400 cc  Urine output: not recorded  Indications for surgery: The patient is a 55 yo female, who presented with menometrorrhagia. Ultrasound revealed a fibroid uterus with one submucosal myoma.  The risks of the surgery were reviewed with the patient and the consent form was signed prior to her surgery.  Findings: Hysteroscopy: submucosal myoma, small endometrial polyp, normal tubal ostia bilaterally   Specimens: fibroid, polyp, endometrial curetting's.    Procedure: The patient was taken to the operating room with an IV in place. She was placed in the dorsal lithotomy position and anesthesia was administered. She was prepped and draped in the usual sterile fashion for a vaginal procedure. She voided on the way to the OR. A weighted speculum was placed in the vagina and a single tooth tenaculum was placed on the anterior lip of the cervix. The cervix was dilated to a #7 hagar dilator. The uterus was sounded to 11 cm. The myosure hysteroscope was inserted into the uterine cavity. With continuous infusion of normal saline, the uterine cavity was visualized with the above findings. The myosure XL was used to resect the fibroid and polyp. The myosure was then removed, after a few minutes it was reinserted, a small portion of the fibroid remained and was removed at this time. The myosure was removed and the cavity was curetted with the small sharp curette. The cavity had the characteristically gritty texture at the end of the procedure. The curette was removed and the mirena intrauterine device was inserted in the usual fashion. The strings were cut to 2 cm. The single tooth tenaculum were  removed. The speculum was removed. The patients perineum was cleansed of hibiclens and she was taken out of the dorsal lithotomy position.  Upon awakening the LMA was removed and the patient was transferred to the recovery room in stable and awake condition.  The sponge and instrument count were correct. There were no complications.

## 2021-06-26 NOTE — Anesthesia Procedure Notes (Signed)
Procedure Name: LMA Insertion Date/Time: 06/26/2021 10:36 AM Performed by: Suan Halter, CRNA Pre-anesthesia Checklist: Patient identified, Emergency Drugs available, Suction available and Patient being monitored Patient Re-evaluated:Patient Re-evaluated prior to induction Oxygen Delivery Method: Circle system utilized Preoxygenation: Pre-oxygenation with 100% oxygen Induction Type: IV induction Ventilation: Mask ventilation without difficulty LMA: LMA inserted LMA Size: 4.0 Number of attempts: 1 Airway Equipment and Method: Bite block Placement Confirmation: positive ETCO2 Tube secured with: Tape Dental Injury: Teeth and Oropharynx as per pre-operative assessment

## 2021-06-26 NOTE — Transfer of Care (Signed)
Immediate Anesthesia Transfer of Care Note  Patient: JAYDEEN DARLEY  Procedure(s) Performed: Procedure(s) (LRB): Hysteroscopic myomectomy, Dilation & Curettage (N/A) Mirena INTRAUTERINE DEVICE (IUD) INSERTION (N/A)  Patient Location: PACU  Anesthesia Type: General  Level of Consciousness: awake, oriented, sedated and patient cooperative  Airway & Oxygen Therapy: Patient Spontanous Breathing on room air Post-op Assessment: Report given to PACU RN and Post -op Vital signs reviewed and stable  Post vital signs: Reviewed and stable  Complications: No apparent anesthesia complications  Last Vitals:  Vitals Value Taken Time  BP 135/94 06/26/21 1152  Temp 36.4 C 06/26/21 1152  Pulse 68 06/26/21 1152  Resp 18 06/26/21 1152  SpO2 98 % 06/26/21 1152    Last Pain:  Vitals:   06/26/21 1152  TempSrc:   PainSc: 4       Patients Stated Pain Goal: 5 (59/93/57 0177)  Complications: No notable events documented.

## 2021-06-26 NOTE — Anesthesia Postprocedure Evaluation (Signed)
Anesthesia Post Note  Patient: Kayla Rojas  Procedure(s) Performed: Hysteroscopic myomectomy, Dilation & Curettage Mirena INTRAUTERINE DEVICE (IUD) INSERTION     Patient location during evaluation: PACU Anesthesia Type: General Level of consciousness: awake and alert Pain management: pain level controlled Vital Signs Assessment: post-procedure vital signs reviewed and stable Respiratory status: spontaneous breathing, nonlabored ventilation, respiratory function stable and patient connected to nasal cannula oxygen Cardiovascular status: blood pressure returned to baseline and stable Postop Assessment: no apparent nausea or vomiting Anesthetic complications: no   No notable events documented.  Last Vitals:  Vitals:   06/26/21 1152 06/26/21 1214  BP: (!) 135/94 116/86  Pulse: 68 71  Resp: 18 18  Temp: 36.4 C 36.6 C  SpO2: 98% 97%    Last Pain:  Vitals:   06/26/21 1214  TempSrc:   PainSc: 0-No pain                 Ayven Glasco

## 2021-06-26 NOTE — Progress Notes (Signed)
Dr. Eligha Bridegroom aware of K 3.1 and glucoxe 173. No new orders at this time.

## 2021-06-27 ENCOUNTER — Encounter (HOSPITAL_BASED_OUTPATIENT_CLINIC_OR_DEPARTMENT_OTHER): Payer: Self-pay | Admitting: Obstetrics and Gynecology

## 2021-06-27 LAB — SURGICAL PATHOLOGY

## 2021-07-03 ENCOUNTER — Ambulatory Visit (INDEPENDENT_AMBULATORY_CARE_PROVIDER_SITE_OTHER): Payer: No Typology Code available for payment source | Admitting: Obstetrics and Gynecology

## 2021-07-03 ENCOUNTER — Encounter: Payer: Self-pay | Admitting: Obstetrics and Gynecology

## 2021-07-03 ENCOUNTER — Other Ambulatory Visit: Payer: Self-pay

## 2021-07-03 VITALS — BP 110/60 | HR 76 | Wt 182.0 lb

## 2021-07-03 DIAGNOSIS — Z9889 Other specified postprocedural states: Secondary | ICD-10-CM

## 2021-07-03 DIAGNOSIS — Z1211 Encounter for screening for malignant neoplasm of colon: Secondary | ICD-10-CM

## 2021-07-03 DIAGNOSIS — Z30431 Encounter for routine checking of intrauterine contraceptive device: Secondary | ICD-10-CM

## 2021-07-03 NOTE — Progress Notes (Signed)
GYNECOLOGY  VISIT   HPI: 55 y.o.   Divorced Black or [place] American Not Hispanic or Latino  female   [phone] with No LMP recorded. (Menstrual status: Irregular Periods).   here for 2 week post op follow hysteroscopy myomectomy, polypectomy, D&C and mirena IUD insertion.  Patient states that she is feeling well.   GYNECOLOGIC HISTORY: No LMP recorded. (Menstrual status: Irregular Periods). Contraception:Tubal ligation  Menopausal hormone therapy: none        OB History     Gravida  4   Para  2   Term  2   Preterm  0   AB  2   Living  2      SAB  0   IAB  2   Ectopic  0   Multiple  0   Live Births  2              Patient Active Problem List   Diagnosis Date Noted   Type 2 diabetes mellitus ([place]) 05/16/2021   Chronic vaginitis 06/13/2018   Hypertension 06/05/2018    Past Medical History:  Diagnosis Date   Diabetes mellitus    controlled by diet   History of abnormal Pap smear 08/14/2001   ASCUS with Neg HR HPV   Hypertension    Neuromuscular disorder ([place])    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   Substance abuse ([place])    trichomonas 12/2018 treated   Type 2 diabetes mellitus ([place])    Vulvar vestibulitis 2005    Past Surgical History:  Procedure Laterality Date   BUNIONECTOMY Bilateral    CESAREAN SECTION     X 2   CHOLECYSTECTOMY, LAPAROSCOPIC  11/2009   HYSTEROSCOPY N/A 06/26/2021   Procedure: Hysteroscopic myomectomy, Dilation & Curettage;  Surgeon: Salvadore Dom, MD;  Location: [facility];  Service: Gynecology;  Laterality: N/A;   INDUCED ABORTION     INTRAUTERINE DEVICE (IUD) INSERTION N/A 06/26/2021   Procedure: Mirena INTRAUTERINE DEVICE (IUD) INSERTION;  Surgeon: Salvadore Dom, MD;  Location: [facility];  Service: Gynecology;  Laterality: N/A;   LAPAROSCOPY N/A 06/16/2013   Procedure: LAPAROSCOPY DIAGNOSTIC;  Surgeon: Lyman Speller, MD;  Location: [facility];  Service: Gynecology;   Laterality: N/A;   TUBAL LIGATION  2000    Current Outpatient Medications  Medication Sig Dispense Refill   atenolol (TENORMIN) 50 MG tablet Take 50 mg by mouth daily. am     betamethasone valerate ointment (VALISONE) 0.1 % Use a pea sized amount topically BID for 1-2 weeks as needed 15 g 0   Cholecalciferol (VITAMIN D3) 125 MCG (5000 UT) CAPS Take 1 capsule by mouth daily.     empagliflozin (JARDIANCE) 10 MG TABS tablet Take 10 mg by mouth daily.     ferrous sulfate 325 (65 FE) MG tablet Take 325 mg by mouth daily with breakfast.     imiquimod (ALDARA) 5 % cream Apply topically 3 (three) times a week. Apply entire packet of cream to affected areas 3 times weekly.  Apply at night and leave on during sleeping hours.  Wash off completely after 8 hours. 12 each 2   indapamide (LOZOL) 2.5 MG tablet Take 2.5 mg by mouth daily.     naproxen sodium (ANAPROX) 220 MG tablet Take 440 mg by mouth daily as needed (menstrual cycle).     olmesartan (BENICAR) 40 MG tablet Take 40 mg by mouth daily. am     Probiotic Product (PROBIOTIC  DAILY PO) Take 1 capsule by mouth daily. am     vitamin B-12 (CYANOCOBALAMIN) 100 MCG tablet Take 100 mcg by mouth daily.     No current facility-administered medications for this visit.     ALLERGIES: Lisinopril and Betadine [povidone iodine]  Family History  Problem Relation Age of Onset   Peripheral vascular disease Mother    Diabetes Father    Cirrhosis Father    Multiple sclerosis Sister    Heart disease Maternal Aunt        CABG   Diabetes Maternal Uncle    Hypertension Maternal Uncle    Diabetes Paternal Aunt    Hypertension Paternal Aunt    Breast cancer Paternal Aunt 60   Diabetes Maternal Grandmother    Heart disease Maternal Grandmother        CVA   Breast cancer Daughter 72   Other Daughter     Social History   Socioeconomic History   Marital status: Divorced    Spouse name: Not on file   Number of children: Not on file   Years of education:  Not on file   Highest education level: Not on file  Occupational History   Not on file  Tobacco Use   Smoking status: Every Day    Packs/day: 0.25    Types: Cigarettes   Smokeless tobacco: Never  Vaping Use   Vaping Use: Never used  Substance and Sexual Activity   Alcohol use: Yes    Comment: about 4 beers per week as of 06/20/2021   Drug use: No   Sexual activity: Yes    Partners: Male    Birth control/protection: Surgical    Comment: BTL  Other Topics Concern   Not on file  Social History Narrative   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  PHYSICAL EXAMINATION:    BP 110/60   Pulse 76   Wt 182 lb (82.6 kg)   SpO2 100%   BMI 31.24 kg/m     General appearance: alert, cooperative and appears stated age   Pelvic: External genitalia:  no lesions              Urethra:  normal appearing urethra with no masses, tenderness or lesions              Bartholins and Skenes: normal                 Vagina: normal appearing vagina with normal color and discharge, no lesions              Cervix: no lesions and IUD strings 3 cm              Bimanual Exam:  Uterus:  normal size, contour, position, consistency, mobility, non-tender and anteverted              Adnexa: no mass, fullness, tenderness                Chaperone was present for exam.  1. Status post hysteroscopic myomectomy Doing well  2. IUD check up Doing well  3. Colon cancer screening Discussed options  will refer for colonoscopy - Ambulatory referral to Gastroenterology  She had a pap in 8/22, will return for an annual exam in 8/23 States her mammogram is UTD

## 2021-08-16 ENCOUNTER — Ambulatory Visit: Payer: 59 | Admitting: Obstetrics and Gynecology

## 2021-10-24 ENCOUNTER — Ambulatory Visit (INDEPENDENT_AMBULATORY_CARE_PROVIDER_SITE_OTHER): Payer: No Typology Code available for payment source | Admitting: Ophthalmology

## 2021-10-24 ENCOUNTER — Encounter (INDEPENDENT_AMBULATORY_CARE_PROVIDER_SITE_OTHER): Payer: Self-pay | Admitting: Ophthalmology

## 2021-10-24 ENCOUNTER — Other Ambulatory Visit: Payer: Self-pay

## 2021-10-24 DIAGNOSIS — H43822 Vitreomacular adhesion, left eye: Secondary | ICD-10-CM

## 2021-10-24 DIAGNOSIS — H43821 Vitreomacular adhesion, right eye: Secondary | ICD-10-CM | POA: Insufficient documentation

## 2021-10-24 DIAGNOSIS — H2513 Age-related nuclear cataract, bilateral: Secondary | ICD-10-CM | POA: Diagnosis not present

## 2021-10-24 NOTE — Assessment & Plan Note (Signed)
Physiologic OU 

## 2021-10-24 NOTE — Progress Notes (Signed)
10/24/2021     CHIEF COMPLAINT Patient presents for  Chief Complaint  Patient presents with   Retina Follow Up      HISTORY OF PRESENT ILLNESS: Kayla Rojas is a 56 y.o. female who presents to the clinic today for:   HPI     Retina Follow Up           Diagnosis: Other   Laterality: both eyes   Onset: 1 year ago   Severity: mild   Duration: 1 year   Course: gradually worsening         Comments   NP- Pt was last seen here in 2018- OCT OU   Pt denies any distortion, wavy vision, FOL or floaters Pt states, "My eyes are just getting progressively blurred and I am having more and more trouble seeing at distance."  Pt states, "last check on my blood sugar was high."         Last edited by Kendra Opitz, COA on 10/24/2021  9:45 AM.      Referring physician: Jonathon Jordan, MD [address],  [place] [address]  HISTORICAL INFORMATION:   Selected notes from the [facility]: No current outpatient medications on file. (Ophthalmic Drugs)   No current facility-administered medications for this visit. (Ophthalmic Drugs)   Current Outpatient Medications (Other)  Medication Sig   atenolol (TENORMIN) 50 MG tablet Take 50 mg by mouth daily. am   betamethasone valerate ointment (VALISONE) 0.1 % Use a pea sized amount topically BID for 1-2 weeks as needed   Cholecalciferol (VITAMIN D3) 125 MCG (5000 UT) CAPS Take 1 capsule by mouth daily.   empagliflozin (JARDIANCE) 10 MG TABS tablet Take 10 mg by mouth daily.   ferrous sulfate 325 (65 FE) MG tablet Take 325 mg by mouth daily with breakfast.   imiquimod (ALDARA) 5 % cream Apply topically 3 (three) times a week. Apply entire packet of cream to affected areas 3 times weekly.  Apply at night and leave on during sleeping hours.  Wash off completely after 8 hours.   indapamide (LOZOL) 2.5 MG tablet Take 2.5 mg by mouth daily.   naproxen sodium (ANAPROX)  220 MG tablet Take 440 mg by mouth daily as needed (menstrual cycle).   olmesartan (BENICAR) 40 MG tablet Take 40 mg by mouth daily. am   Probiotic Product (PROBIOTIC DAILY PO) Take 1 capsule by mouth daily. am   vitamin B-12 (CYANOCOBALAMIN) 100 MCG tablet Take 100 mcg by mouth daily.   No current facility-administered medications for this visit. 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   Substance abuse ([place])    trichomonas 12/2018 treated   Type 2 diabetes mellitus ([place])    Vulvar vestibulitis 2005   Past Surgical History:  Procedure Laterality Date   BUNIONECTOMY Bilateral    CESAREAN SECTION     X 2   CHOLECYSTECTOMY, LAPAROSCOPIC  11/2009   HYSTEROSCOPY N/A 06/26/2021   Procedure: Hysteroscopic  myomectomy, Dilation & Curettage;  Surgeon: Salvadore Dom, MD;  Location: [facility];  Service: Gynecology;  Laterality: N/A;   INDUCED ABORTION     INTRAUTERINE DEVICE (IUD) INSERTION N/A 06/26/2021   Procedure: Mirena INTRAUTERINE DEVICE (IUD) INSERTION;  Surgeon: Salvadore Dom, MD;  Location: [facility];  Service: Gynecology;  Laterality: N/A;   LAPAROSCOPY N/A 06/16/2013   Procedure: LAPAROSCOPY DIAGNOSTIC;  Surgeon: Lyman Speller, MD;  Location: [facility];  Service: Gynecology;  Laterality: N/A;   TUBAL LIGATION  2000    FAMILY HISTORY Family History  Problem Relation Age of Onset   Peripheral vascular disease Mother    Diabetes Father    Cirrhosis Father    Multiple sclerosis Sister    Heart disease Maternal Aunt        CABG   Diabetes Maternal Uncle     Hypertension Maternal Uncle    Diabetes Paternal Aunt    Hypertension Paternal Aunt    Breast cancer Paternal Aunt 10   Diabetes Maternal Grandmother    Heart disease Maternal Grandmother        CVA   Breast cancer Daughter 65   Other Daughter     SOCIAL HISTORY Social History   Tobacco Use   Smoking status: Every Day    Packs/day: 0.25    Types: Cigarettes   Smokeless tobacco: Never  Vaping Use   Vaping Use: Never used  Substance Use Topics   Alcohol use: Yes    Comment: about 4 beers per week as of 06/20/2021   Drug use: No         OPHTHALMIC EXAM:  Base Eye Exam     Visual Acuity (ETDRS)       Right Left   Dist Max 20/40 20/50 -2   Dist ph [place] 20/20 -2 20/25         Tonometry (Tonopen, 9:49 AM)       Right Left   Pressure 14 14         Pupils       Pupils Shape React APD   Right PERRL Round Brisk None   Left PERRL Round Brisk None         Visual Fields (Counting fingers)       Left Right    Full Full         Extraocular Movement       Right Left    Full, Ortho Full, Ortho         Neuro/Psych     Oriented x3: Yes         Dilation     Both eyes: 1.0% Mydriacyl, 2.5% Phenylephrine @ 9:49 AM           Slit Lamp and Fundus Exam     External Exam       Right Left   External Normal Normal         Slit Lamp Exam       Right Left   Lids/Lashes Normal Normal   Conjunctiva/Sclera White and quiet White and quiet   Cornea Clear Clear   Anterior Chamber Deep and quiet Deep and quiet   Iris Round and reactive Round and reactive   Lens Clear Clear   Anterior Vitreous Normal Normal         Fundus Exam       Right Left   Posterior Vitreous Normal Normal   Disc Normal Normal   C/D Ratio 0.2  0.35   Macula Normal Normal   Vessels no DR no DR   Periphery Normal Normal            IMAGING AND PROCEDURES  Imaging and Procedures for 10/24/21  OCT, Retina - OU - Both Eyes       Right Eye Quality was good.  Scan locations included subfoveal. Central Foveal Thickness: 261. Progression has been stable. Findings include normal foveal contour.   Left Eye Quality was good. Scan locations included subfoveal. Central Foveal Thickness: 259. Progression has been stable. Findings include normal foveal contour.              ASSESSMENT/PLAN:  Type 2 diabetes mellitus ([place]) The patient has diabetes without any evidence of retinopathy. The patient advised to maintain good blood glucose control, excellent blood pressure control, and favorable levels of cholesterol, low density lipoprotein, and high density lipoproteins. Follow up in 1 year was recommended. Explained that fluctuations in visual acuity , or "out of focus", may result from large variations of blood sugar control.  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 Vitreomacular adhesion of left eye  H43.822 OCT, Retina - OU - Both Eyes    2. Vitreomacular adhesion, right  H43.821 OCT, Retina - OU - Both Eyes    3. Nuclear sclerotic cataract of both eyes  H25.13       1.  Follow-up dilate examination OU after 5 years of no exam.  No detectable diabetic retinopathy in either eye  2.  Physiologic conditions OU with mild NSC cataracts age-appropriate  3.  Ophthalmic Meds Ordered this visit:  No orders of the defined types were placed in this encounter.      Return in about 2 years (around 10/25/2023) for DILATE OU, COLOR FP, OCT.  There are no Patient Instructions on file for this visit.   Explained the diagnoses, plan, and follow up with the patient and they expressed understanding.  Patient expressed understanding of the importance of proper follow up care.   Clent Demark Myleigh Amara M.D. Diseases & Surgery of the Retina and Vitreous [facility]ina [facility] Diabetic  [place] 10/24/21     Abbreviations: M myopia (nearsighted); A astigmatism; H hyperopia (farsighted); P presbyopia; Mrx spectacle prescription;  CTL contact lenses; OD right eye; OS left eye; OU both eyes  XT exotropia; ET esotropia; PEK punctate epithelial keratitis; PEE punctate epithelial erosions; DES dry eye syndrome; MGD meibomian gland dysfunction; ATs artificial tears; PFAT's preservative free artificial tears; [place] nuclear sclerotic cataract; PSC posterior subcapsular cataract; ERM epi-retinal membrane; PVD posterior vitreous detachment; RD retinal detachment; DM diabetes mellitus; DR diabetic retinopathy; NPDR non-proliferative diabetic retinopathy; PDR proliferative diabetic retinopathy; CSME clinically significant macular edema; DME diabetic macular edema; dbh dot blot hemorrhages; CWS cotton wool spot; POAG primary open angle glaucoma; C/D cup-to-disc ratio; HVF humphrey visual field; GVF goldmann visual field; OCT optical coherence tomography; IOP intraocular pressure; BRVO Branch retinal vein occlusion; CRVO central retinal vein occlusion; CRAO central retinal artery occlusion; BRAO branch retinal artery occlusion; RT retinal tear; SB scleral buckle; PPV pars plana vitrectomy; VH Vitreous hemorrhage; PRP panretinal laser photocoagulation; IVK intravitreal kenalog; VMT vitreomacular traction; MH Macular hole;  NVD neovascularization of the disc; NVE neovascularization elsewhere; AREDS age related eye disease study; ARMD age related macular degeneration; POAG primary open angle glaucoma; EBMD  epithelial/anterior basement membrane dystrophy; ACIOL anterior chamber intraocular lens; IOL intraocular lens; PCIOL posterior chamber intraocular lens; Phaco/IOL phacoemulsification with intraocular lens placement; [place] photorefractive keratectomy; LASIK laser assisted in situ keratomileusis; HTN hypertension; DM diabetes mellitus; COPD chronic obstructive pulmonary disease

## 2021-10-24 NOTE — Assessment & Plan Note (Addendum)
The patient has diabetes without any evidence of retinopathy. The patient advised to maintain good blood glucose control, excellent blood pressure control, and favorable levels of cholesterol, low density lipoprotein, and high density lipoproteins. Follow up in 1 year was recommended. Explained that fluctuations in visual acuity , or "out of focus", may result from large variations of blood sugar control.  No detectable diabetic retinopathy

## 2021-10-24 NOTE — Assessment & Plan Note (Signed)
OU Minor not visually significant

## 2021-11-27 ENCOUNTER — Telehealth: Payer: Self-pay | Admitting: *Deleted

## 2021-11-27 ENCOUNTER — Encounter: Payer: Self-pay | Admitting: Obstetrics and Gynecology

## 2021-11-27 ENCOUNTER — Ambulatory Visit (INDEPENDENT_AMBULATORY_CARE_PROVIDER_SITE_OTHER): Payer: PRIVATE HEALTH INSURANCE | Admitting: Obstetrics and Gynecology

## 2021-11-27 ENCOUNTER — Telehealth: Payer: Self-pay

## 2021-11-27 VITALS — BP 122/67 | HR 75 | Ht 64.0 in | Wt 183.0 lb

## 2021-11-27 DIAGNOSIS — N760 Acute vaginitis: Secondary | ICD-10-CM | POA: Diagnosis not present

## 2021-11-27 DIAGNOSIS — M549 Dorsalgia, unspecified: Secondary | ICD-10-CM | POA: Diagnosis not present

## 2021-11-27 DIAGNOSIS — B9689 Other specified bacterial agents as the cause of diseases classified elsewhere: Secondary | ICD-10-CM

## 2021-11-27 LAB — WET PREP FOR TRICH, YEAST, CLUE

## 2021-11-27 MED ORDER — METRONIDAZOLE 0.75 % VA GEL: VAGINAL | 0 refills | Status: DC

## 2021-11-27 NOTE — Patient Instructions (Signed)
Bacterial Vaginosis °Bacterial vaginosis is an infection that occurs when the normal balance of bacteria in the vagina changes. This change is caused by an overgrowth of certain bacteria in the vagina. Bacterial vaginosis is the most common vaginal infection among females aged 54 to 44 years. °This condition increases the risk of sexually transmitted infections (STIs). Treatment can help reduce this risk. Treatment is very important for pregnant women because this condition can cause babies to be born early (prematurely) or at a low birth weight. °What are the causes? °This condition is caused by an increase in harmful bacteria that are normally present in small amounts in the vagina. However, the exact reason this condition develops is not known. °You cannot get bacterial vaginosis from toilet seats, bedding, swimming pools, or contact with objects around you. °What increases the risk? °The following factors may make you more likely to develop this condition: °Having a new sexual partner or multiple sexual partners, or having unprotected sex. °Douching. °Having an intrauterine device (IUD). °Smoking. °Abusing drugs and alcohol. This may lead to riskier sexual behavior. °Taking certain antibiotic medicines. °Being pregnant. °What are the signs or symptoms? °Some women with this condition have no symptoms. Symptoms may include: °Gray or white vaginal discharge. The discharge can be watery or foamy. °A fish-like odor with discharge, especially after sex or during menstruation. °Itching in and around the vagina. °Burning or pain with urination. °How is this diagnosed? °This condition is diagnosed based on: °Your medical history. °A physical exam of the vagina. °Checking a sample of vaginal fluid for harmful bacteria or abnormal cells. °How is this treated? °This condition is treated with antibiotic medicines. These may be given as a pill, a vaginal cream, or a medicine that is put into the vagina (suppository). If the  condition comes back after treatment, a second round of antibiotics may be needed. °Follow these instructions at home: °Medicines °Take or apply over-the-counter and prescription medicines only as told by your health care provider. °Take or apply your antibiotic medicine as told by your health care provider. Do not stop using the antibiotic even if you start to feel better. °General instructions °If you have a female sexual partner, tell her that you have a vaginal infection. She should follow up with her health care provider. If you have a female sexual partner, he does not need treatment. °Avoid sexual activity until you finish treatment. °Drink enough fluid to keep your urine pale yellow. °Keep the area around your vagina and rectum clean. °Wash the area daily with warm water. °Wipe yourself from front to back after using the toilet. °If you are breastfeeding, talk to your health care provider about continuing breastfeeding during treatment. °Keep all follow-up visits. This is important. °How is this prevented? °Self-care °Do not douche. °Wash the outside of your vagina with warm water only. °Wear cotton or cotton-lined underwear. °Avoid wearing tight pants and pantyhose, especially during the summer. °Safe sex °Use protection when having sex. This includes: °Using condoms. °Using dental dams. This is a thin layer of a material made of latex or polyurethane that protects the mouth during oral sex. °Limit the number of sexual partners. To help prevent bacterial vaginosis, it is best to have sex with just one partner (monogamous relationship). °Make sure you and your sexual partner are tested for STIs. °Drugs and alcohol °Do not use any products that contain nicotine or tobacco. These products include cigarettes, chewing tobacco, and vaping devices, such as e-cigarettes. If you need help quitting,   ask your health care provider. °Do not use drugs. °Do not drink alcohol if: °Your health care provider tells you not to  do this. °You are pregnant, may be pregnant, or are planning to become pregnant. °If you drink alcohol: °Limit how much you have to 0-1 drink a day. °Be aware of how much alcohol is in your drink. In the [place] one drink equals one 12 oz bottle of beer (355 mL), one 5 oz glass of wine (148 mL), or one 1½ oz glass of hard liquor (44 mL). °Where to find more information °Centers for Disease Control and Prevention: www.cdc.gov °American Sexual Health Association (ASHA): www.ashastd.org °U.S. Department of Health and Human Services, Office on Women's Health: www.womenshealth.gov °Contact a health care provider if: °Your symptoms do not improve, even after treatment. °You have more discharge or pain when urinating. °You have a fever or chills. °You have pain in your abdomen or pelvis. °You have pain during sex. °You have vaginal bleeding between menstrual periods. °Summary °Bacterial vaginosis is a vaginal infection that occurs when the normal balance of bacteria in the vagina changes. It results from an overgrowth of certain bacteria. °This condition increases the risk of sexually transmitted infections (STIs). Getting treated can help reduce this risk. °Treatment is very important for pregnant women because this condition can cause babies to be born early (prematurely) or at low birth weight. °This condition is treated with antibiotic medicines. These may be given as a pill, a vaginal cream, or a medicine that is put into the vagina (suppository). °This information is not intended to replace advice given to you by your health care provider. Make sure you discuss any questions you have with your health care provider. °Document Revised: 03/10/2020 Document Reviewed: 03/10/2020 °Elsevier Patient Education © 2022 Elsevier Inc. ° °

## 2021-11-27 NOTE — Telephone Encounter (Signed)
FYI. Pt calling to report water like discharge and vaginal odor. Also reports LLQ pains. Denies any urinary sxs. Will send msg to appointment desk to schedule.  ?

## 2021-11-27 NOTE — Progress Notes (Signed)
GYNECOLOGY  VISIT ?  ?HPI: ?56 y.o.   Divorced Black or [place] American Not Hispanic or Latino  female   ?[id number] with Patient's last menstrual period was 11/23/2021.   ?here for a watery discharge with an odor.  The d/c started last month, a large amount of D/C. Slight burning/itching. ?Not currently sexually active, not since 11/22.  No worries about STD's, negative testing in 8/22. ?Also have pain on the left side of her back that radiates to her side.  ?No urinary frequency, urgency, or dysuria.  ? ? ?GYNECOLOGIC HISTORY: ?Patient's last menstrual period was 11/23/2021. ?Contraception: mirnea IUD, placed in 10/22.  ?Menopausal hormone therapy: none  ?       ?OB History   ? ? Gravida  ?4  ? Para  ?2  ? Term  ?2  ? Preterm  ?0  ? AB  ?2  ? Living  ?2  ?  ? ? SAB  ?0  ? IAB  ?2  ? Ectopic  ?0  ? Multiple  ?0  ? Live Births  ?2  ?   ?  ?  ?    ? ?Patient Active Problem List  ? Diagnosis Date Noted  ? Nuclear sclerotic cataract of both eyes 10/24/2021  ? Vitreomacular adhesion, right 10/24/2021  ? Vitreomacular adhesion of left eye 10/24/2021  ? Type 2 diabetes mellitus ([place]) 05/16/2021  ? Chronic vaginitis 06/13/2018  ? Hypertension 06/05/2018  ? ? ?Past Medical History:  ?Diagnosis Date  ? Diabetes mellitus   ? controlled by diet  ? History of abnormal Pap smear 08/14/2001  ? ASCUS with Neg HR HPV  ? Hypertension   ? Neuromuscular disorder ([place])   ? 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 Substance abuse ([place])   ? trichomonas 12/2018 treated  ? Type 2 diabetes mellitus ([place])   ? Vulvar vestibulitis 2005  ? ? ?Past Surgical History:  ?Procedure Laterality Date  ? BUNIONECTOMY Bilateral   ? CESAREAN SECTION    ? X 2  ? CHOLECYSTECTOMY, LAPAROSCOPIC  11/2009  ? HYSTEROSCOPY N/A 06/26/2021  ? Procedure: Hysteroscopic myomectomy, Dilation & Curettage;  Surgeon: Salvadore Dom, MD;  Location: [facility];  Service: Gynecology;  Laterality: N/A;  ? INDUCED ABORTION    ? INTRAUTERINE DEVICE (IUD)  INSERTION N/A 06/26/2021  ? Procedure: Mirena INTRAUTERINE DEVICE (IUD) INSERTION;  Surgeon: Salvadore Dom, MD;  Location: [facility];  Service: Gynecology;  Laterality: N/A;  ? LAPAROSCOPY N/A 06/16/2013  ? Procedure: LAPAROSCOPY DIAGNOSTIC;  Surgeon: Lyman Speller, MD;  Location: [facility];  Service: Gynecology;  Laterality: N/A;  ? TUBAL LIGATION  2000  ? ? ?Current Outpatient Medications  ?Medication Sig Dispense Refill  ? atenolol (TENORMIN) 50 MG tablet Take 50 mg by mouth daily. am    ? betamethasone valerate ointment (VALISONE) 0.1 % Use a pea sized amount topically BID for 1-2 weeks as needed 15 g 0  ? Cholecalciferol (VITAMIN D3) 125 MCG (5000 UT) CAPS Take 1 capsule by mouth daily.    ? empagliflozin (JARDIANCE) 10 MG TABS tablet Take 10 mg by mouth daily.    ? ferrous sulfate 325 (65 FE) MG tablet Take 325 mg by mouth daily with breakfast.    ? imiquimod (ALDARA) 5 % cream Apply topically 3 (three) times a week. Apply entire packet of cream to affected areas 3 times weekly.  Apply at night and leave on during sleeping hours.  Wash off  completely after 8 hours. 12 each 2  ? indapamide (LOZOL) 2.5 MG tablet Take 2.5 mg by mouth daily.    ? naproxen sodium (ANAPROX) 220 MG tablet Take 440 mg by mouth daily as needed (menstrual cycle).    ? olmesartan (BENICAR) 40 MG tablet Take 40 mg by mouth daily. am    ? Probiotic Product (PROBIOTIC DAILY PO) Take 1 capsule by mouth daily. am    ? vitamin B-12 (CYANOCOBALAMIN) 100 MCG tablet Take 100 mcg by mouth daily.    ? ?No current facility-administered medications for this visit.  ?  ? ?ALLERGIES: Lisinopril and Betadine [povidone iodine] ? ?Family History  ?Problem Relation Age of Onset  ? Peripheral vascular disease Mother   ? Diabetes Father   ? Cirrhosis Father   ? Multiple sclerosis Sister   ? Heart disease Maternal Aunt   ?     CABG  ? Diabetes Maternal Uncle   ? Hypertension Maternal Uncle   ? Diabetes Paternal Aunt   ? Hypertension  Paternal Aunt   ? Breast cancer Paternal Aunt 31  ? Diabetes Maternal Grandmother   ? Heart disease Maternal Grandmother   ?     CVA  ? Breast cancer Daughter 42  ? Other Daughter   ? ? ?Social History  ? ?Socioeconomic History  ? Marital status: Divorced  ?  Spouse name: Not on file  ? Number of children: Not on file  ? Years of education: Not on file  ? Highest education level: Not on file  ?Occupational History  ? Not on file  ?Tobacco Use  ? Smoking status: Every Day  ?  Packs/day: 0.25  ?  Types: Cigarettes  ? Smokeless tobacco: Never  ?Vaping Use  ? Vaping Use: Never used  ?Substance and Sexual Activity  ? Alcohol use: Yes  ?  Comment: about 4 beers per week as of 06/20/2021  ? Drug use: No  ? Sexual activity: Yes  ?  Partners: Male  ?  Birth control/protection: Surgical  ?  Comment: BTL  ?Other Topics Concern  ? Not on file  ?Social History Narrative  ? 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 ?Social Determinants of Health  ? ?Financial Resource Strain: Not on file  ?Food Insecurity: Not on file  ?Transportation Needs: Not on file  ?Physical Activity: Not on file  ?Stress: Not on file  ?Social Connections: Not on file  ?Intimate Partner Violence: Not on file  ? ? ?Review of Systems  ?All other systems reviewed and are negative. ? ?PHYSICAL EXAMINATION:   ? ?BP 122/67   Pulse 75   Ht '5\' 4"'$  (1.626 m)   Wt 183 lb (83 kg)   LMP 11/23/2021   SpO2 99%   BMI 31.41 kg/m?     ?General appearance: alert, cooperative and appears stated age ?Back: no cva tenderness, she points to her upper pelvic bone (ilium) on the left as to the area of pain.  ?Abdomen: soft, mildly tender lateral LLQ (in the region of her descending colon), no rebound, no guarding; non distended, no masses,  no organomegaly ? ?Pelvic: External genitalia:  no lesions ?             Urethra:  normal appearing urethra with no masses, tenderness or lesions ?             Bartholins and Skenes: normal    ?  Vagina: normal appearing  vagina with watery/bloody vaginal d/c with an odor ?             Cervix: no cervical motion tenderness and no lesions ?             Bimanual Exam:  Uterus:  normal size, contour, position, consistency, mobility, non-tender ?             Adnexa: no mass, fullness, tenderness ?              ? ?Chaperone was present for exam. ? ?1. Acute vaginitis ?- WET PREP FOR TRICH, YEAST, CLUE ? ?2. BV (bacterial vaginosis) ?- metroNIDAZOLE (METROGEL) 0.75 % vaginal gel; One applicator full qhs x 5 days  Dispense: 70 g; Refill: 0 ? ?3. Back pain with radiation ?Recommended she f/u with her primary, I doubt it is from a gyn source ? ?

## 2021-11-27 NOTE — Telephone Encounter (Signed)
Patient was seen today and treated for BV metrogel 0.75 mg vaginal cream. Patient said she forgot to mention she normally get yeast infections after using cream. She asked if diflucan tablet can be sent in now or call back? Please advise  ?

## 2021-11-28 NOTE — Telephone Encounter (Signed)
People don't usually get yeast from metrogel. She should call with symptoms. ?

## 2021-11-28 NOTE — Telephone Encounter (Signed)
Patient informed. 

## 2022-05-03 NOTE — Progress Notes (Signed)
56 y.o. [id number] Divorced Black or African American Not Hispanic or Latino female here for annual exam.  In 10/22 she underwent a hysteroscopic myomectomy, D&C and mirena IUD insertion. She has occasional spotting.   She is having a vaginal odor and irritation, no d/c. She was treated for BV in 3/23.   She has bumps near the opening of her vagina, itchy. H/O warts. She was previously treated with aldara, but she lost the packets and didn't finish them.  No bowel or bladder issues.   Will see her primary in 9/23.     No LMP recorded. (Menstrual status: IUD).          Sexually active: No.  The current method of family planning is IUD.    Exercising: No.  The patient does not participate in regular exercise at present. Smoker:  no  Health Maintenance: Pap:  05/11/21 WNL Hr HPV Neg, 12-13-14 WNL NEG HR HPV  History of abnormal Pap:  no MMG:  06/16/21 density C Bi-rads 1 neg  BMD:   none  Colonoscopy: 08/2021 normal per patient  TDaP:  12/17/16  Gardasil:  none   She is not currently smoking. Drinking ~14 drinks a week. Works for [employer] from home. 2 kids, 2 grandchildren (38 and 84). Daughter is 27, h/o breast cancer, undergoing treatment (local). Son is 70, living in [place].  Sister has MS, she helps take care of her sister. Sister goes back and forth between Daneesha's house and Carlia's daughter's house.    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   Substance abuse ([place])    trichomonas 12/2018 treated   Type 2 diabetes mellitus ([place])    Vulvar vestibulitis 2005    Past Surgical History:  Procedure Laterality Date   BUNIONECTOMY Bilateral    CESAREAN SECTION     X 2   CHOLECYSTECTOMY, LAPAROSCOPIC  11/2009   HYSTEROSCOPY N/A 06/26/2021   Procedure: Hysteroscopic myomectomy,  Dilation & Curettage;  Surgeon: Salvadore Dom, MD;  Location: [facility];  Service: Gynecology;  Laterality: N/A;   INDUCED ABORTION     INTRAUTERINE DEVICE (IUD) INSERTION N/A 06/26/2021   Procedure: Mirena INTRAUTERINE DEVICE (IUD) INSERTION;  Surgeon: Salvadore Dom, MD;  Location: [facility];  Service: Gynecology;  Laterality: N/A;   LAPAROSCOPY N/A 06/16/2013   Procedure: LAPAROSCOPY DIAGNOSTIC;  Surgeon: Lyman Speller, MD;  Location: [facility];  Service: Gynecology;  Laterality: N/A;   TUBAL LIGATION  2000    Current Outpatient Medications  Medication Sig Dispense Refill   atenolol (TENORMIN) 50 MG tablet Take 50 mg by mouth daily. am     betamethasone valerate ointment (VALISONE) 0.1 % Use a pea sized amount topically BID for 1-2 weeks as needed 15 g 0   Cholecalciferol (VITAMIN D3) 125 MCG (5000 UT) CAPS Take 1 capsule by mouth daily.     empagliflozin (JARDIANCE) 10 MG TABS tablet Take 10 mg by mouth daily.     ferrous sulfate 325 (65 FE) MG tablet Take 325 mg by mouth daily with breakfast.     indapamide (LOZOL) 2.5 MG tablet Take 2.5 mg by mouth daily.     naproxen sodium (ANAPROX) 220 MG tablet Take 440 mg  by mouth daily as needed (menstrual cycle).     olmesartan (BENICAR) 40 MG tablet Take 40 mg by mouth daily. am     Probiotic Product (PROBIOTIC DAILY PO) Take 1 capsule by mouth daily. am     vitamin B-12 (CYANOCOBALAMIN) 100 MCG tablet Take 100 mcg by mouth daily.     No current facility-administered medications for this visit.    Family History  Problem Relation Age of Onset   Peripheral vascular disease Mother    Diabetes Father    Cirrhosis Father    Multiple sclerosis Sister    Heart disease Maternal Aunt        CABG   Diabetes Maternal Uncle    Hypertension Maternal Uncle    Diabetes Paternal Aunt    Hypertension Paternal Aunt    Breast cancer Paternal Aunt 26   Diabetes Maternal Grandmother    Heart disease  Maternal Grandmother        CVA   Breast cancer Daughter 35   Other Daughter     Review of Systems  Genitourinary:  Positive for vaginal discharge.    Exam:   BP 118/64   Pulse 78   Ht '5\' 4"'$  (1.626 m)   Wt 170 lb (77.1 kg)   SpO2 100%   BMI 29.18 kg/m   Weight change: '@WEIGHTCHANGE'$ @ Height:   Height: '5\' 4"'$  (162.6 cm)  Ht Readings from Last 3 Encounters:  05/14/22 '5\' 4"'$  (1.626 m)  11/27/21 '5\' 4"'$  (1.626 m)  06/26/21 '5\' 4"'$  (1.626 m)    General appearance: alert, cooperative and appears stated age Head: Normocephalic, without obvious abnormality, atraumatic Neck: no adenopathy, supple, symmetrical, trachea midline and thyroid normal to inspection and palpation Lungs: clear to auscultation bilaterally Cardiovascular: regular rate and rhythm Breasts: normal appearance, no masses or tenderness Abdomen: soft, non-tender; non distended,  no masses,  no organomegaly Extremities: extremities normal, atraumatic, no cyanosis or edema Skin: Skin color, texture, turgor normal. No rashes or lesions Lymph nodes: Cervical, supraclavicular, and axillary nodes normal. 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 She has a large skin tag on her left labia majora and a large skin tag on her right upper/inner thigh, there is a epidermoid cyst on the inner right labia majora              Urethra:  normal appearing urethra with no masses, tenderness or lesions              Bartholins and Skenes: normal                 Vagina: normal appearing vagina with normal color and discharge, no lesions              Cervix: no lesions and IUD string 4 cm               Bimanual Exam:  Uterus:  normal size, contour, position, consistency, mobility, non-tender              Adnexa: no mass, fullness, tenderness               Rectovaginal: Confirms               Anus:  normal sphincter tone, no lesions  Gae Dry, CMA chaperoned for the exam.  1. Well woman exam Discussed breast self exam Discussed calcium and vit D intake Mammogram due next month  Colonoscopy UTD Labs with primary  2. Acute vaginitis - WET PREP FOR TRICH, YEAST, CLUE: negative - SureSwab Advanced Vaginitis, TMA  3. Genital warts - imiquimod (ALDARA) 5 % cream; Apply topically 3 (three) times a week. Apply entire packet of cream to affected areas 3 times weekly.  Apply at night and leave on during sleeping hours.  Wash off completely after 8 hours.  Dispense: 12 each; Refill: 2

## 2022-05-14 ENCOUNTER — Ambulatory Visit (INDEPENDENT_AMBULATORY_CARE_PROVIDER_SITE_OTHER): Payer: No Typology Code available for payment source | Admitting: Obstetrics and Gynecology

## 2022-05-14 ENCOUNTER — Encounter: Payer: Self-pay | Admitting: Obstetrics and Gynecology

## 2022-05-14 VITALS — BP 118/64 | HR 78 | Ht 64.0 in | Wt 170.0 lb

## 2022-05-14 DIAGNOSIS — A630 Anogenital (venereal) warts: Secondary | ICD-10-CM | POA: Diagnosis not present

## 2022-05-14 DIAGNOSIS — N760 Acute vaginitis: Secondary | ICD-10-CM

## 2022-05-14 DIAGNOSIS — Z01419 Encounter for gynecological examination (general) (routine) without abnormal findings: Secondary | ICD-10-CM | POA: Diagnosis not present

## 2022-05-14 LAB — WET PREP FOR TRICH, YEAST, CLUE

## 2022-05-14 MED ORDER — IMIQUIMOD 5 % EX CREA: TOPICAL_CREAM | CUTANEOUS | 2 refills | Status: DC

## 2022-05-14 NOTE — Patient Instructions (Signed)

## 2022-05-15 LAB — SURESWAB® ADVANCED VAGINITIS,TMA
CANDIDA SPECIES: NOT DETECTED
Candida glabrata: NOT DETECTED
SURESWAB(R) ADV BACTERIAL VAGINOSIS(BV),TMA: NEGATIVE
TRICHOMONAS VAGINALIS (TV),TMA: NOT DETECTED

## 2022-05-16 ENCOUNTER — Other Ambulatory Visit: Payer: Self-pay | Admitting: Family Medicine

## 2022-05-16 DIAGNOSIS — Z1231 Encounter for screening mammogram for malignant neoplasm of breast: Secondary | ICD-10-CM

## 2022-06-18 ENCOUNTER — Ambulatory Visit
Admission: RE | Admit: 2022-06-18 | Discharge: 2022-06-18 | Disposition: A | Discharge status: Home / Self Care | Payer: No Typology Code available for payment source | Source: Ambulatory Visit | Attending: Family Medicine | Admitting: Family Medicine

## 2022-06-18 DIAGNOSIS — Z1231 Encounter for screening mammogram for malignant neoplasm of breast: Secondary | ICD-10-CM

## 2022-11-26 ENCOUNTER — Encounter (INDEPENDENT_AMBULATORY_CARE_PROVIDER_SITE_OTHER): Payer: No Typology Code available for payment source | Admitting: Ophthalmology

## 2023-03-19 ENCOUNTER — Ambulatory Visit: Payer: Self-pay | Admitting: Obstetrics and Gynecology

## 2023-03-19 ENCOUNTER — Encounter: Payer: Self-pay | Admitting: Obstetrics and Gynecology

## 2023-03-19 ENCOUNTER — Ambulatory Visit (INDEPENDENT_AMBULATORY_CARE_PROVIDER_SITE_OTHER): Payer: No Typology Code available for payment source | Admitting: Obstetrics and Gynecology

## 2023-03-19 VITALS — BP 110/72 | HR 78 | Wt 157.0 lb

## 2023-03-19 DIAGNOSIS — R1032 Left lower quadrant pain: Secondary | ICD-10-CM | POA: Insufficient documentation

## 2023-03-19 DIAGNOSIS — B9689 Other specified bacterial agents as the cause of diseases classified elsewhere: Secondary | ICD-10-CM

## 2023-03-19 DIAGNOSIS — B3731 Acute candidiasis of vulva and vagina: Secondary | ICD-10-CM

## 2023-03-19 DIAGNOSIS — Z113 Encounter for screening for infections with a predominantly sexual mode of transmission: Secondary | ICD-10-CM | POA: Diagnosis not present

## 2023-03-19 DIAGNOSIS — N760 Acute vaginitis: Secondary | ICD-10-CM

## 2023-03-19 DIAGNOSIS — R141 Gas pain: Secondary | ICD-10-CM | POA: Insufficient documentation

## 2023-03-19 DIAGNOSIS — R933 Abnormal findings on diagnostic imaging of other parts of digestive tract: Secondary | ICD-10-CM | POA: Insufficient documentation

## 2023-03-19 DIAGNOSIS — K219 Gastro-esophageal reflux disease without esophagitis: Secondary | ICD-10-CM | POA: Insufficient documentation

## 2023-03-19 DIAGNOSIS — R1013 Epigastric pain: Secondary | ICD-10-CM | POA: Insufficient documentation

## 2023-03-19 DIAGNOSIS — N9089 Other specified noninflammatory disorders of vulva and perineum: Secondary | ICD-10-CM

## 2023-03-19 LAB — WET PREP FOR TRICH, YEAST, CLUE

## 2023-03-19 MED ORDER — FLUCONAZOLE 150 MG PO TABS: 150.0000 mg | ORAL_TABLET | Freq: Once | ORAL | 0 refills | Status: AC

## 2023-03-19 MED ORDER — METRONIDAZOLE 0.75 % VA GEL: 1.0000 | Freq: Every day | VAGINAL | 0 refills | Status: DC

## 2023-03-19 NOTE — Progress Notes (Signed)
GYNECOLOGY  VISIT   HPI: 57 y.o.   Divorced Black or [place] American Not Hispanic or Latino  female   [phone] with No LMP recorded. (Menstrual status: IUD).   here for vaginal burning. Symptoms started a week ago, mild d/c. No odor, no itching. No urinary c/o. No vaginal dryness. Newly sexually active again ~3 weeks ago, not painful. Not dry.   In 10/22 she underwent a hysteroscopic myomectomy, D&C and mirena IUD insertion.   GYNECOLOGIC HISTORY: No LMP recorded. (Menstrual status: IUD). Contraception:IUD Menopausal hormone therapy: none         OB History     Gravida  4   Para  2   Term  2   Preterm  0   AB  2   Living  2      SAB  0   IAB  2   Ectopic  0   Multiple  0   Live Births  2              Patient Active Problem List   Diagnosis Date Noted   Nuclear sclerotic cataract of both eyes 10/24/2021   Vitreomacular adhesion, right 10/24/2021   Vitreomacular adhesion of left eye 10/24/2021   Type 2 diabetes mellitus ([facility]) 05/16/2021   Chronic vaginitis 06/13/2018   Hypertension 06/05/2018    Past Medical History:  Diagnosis Date   Diabetes mellitus    controlled by diet   History of abnormal Pap smear 08/14/2001   ASCUS with Neg HR HPV   Hypertension    Neuromuscular disorder ([facility])    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   Substance abuse ([facility])    trichomonas 12/2018 treated   Type 2 diabetes mellitus ([facility])    Vulvar vestibulitis 2005    Past Surgical History:  Procedure Laterality Date   BUNIONECTOMY Bilateral    CESAREAN SECTION     X 2   CHOLECYSTECTOMY, LAPAROSCOPIC  11/2009   HYSTEROSCOPY N/A 06/26/2021   Procedure: Hysteroscopic myomectomy, Dilation & Curettage;  Surgeon: Romualdo Bolk, MD;  Location: [facility];  Service: Gynecology;  Laterality: N/A;   INDUCED ABORTION     INTRAUTERINE DEVICE (IUD) INSERTION N/A 06/26/2021   Procedure: Mirena INTRAUTERINE DEVICE (IUD) INSERTION;  Surgeon: Romualdo Bolk, MD;  Location: [facility];  Service: Gynecology;  Laterality: N/A;   LAPAROSCOPY N/A 06/16/2013   Procedure: LAPAROSCOPY DIAGNOSTIC;  Surgeon: Annamaria Boots, MD;  Location: [facility];  Service: Gynecology;  Laterality: N/A;   TUBAL LIGATION  2000    Current Outpatient Medications  Medication Sig Dispense Refill   atenolol (TENORMIN) 50 MG tablet Take 50 mg by mouth daily. am     betamethasone valerate ointment (VALISONE) 0.1 % Use a pea sized amount topically BID for 1-2 weeks as needed 15 g 0   Cholecalciferol (VITAMIN D3) 125 MCG (5000 UT) CAPS Take 1 capsule by mouth daily.     empagliflozin (JARDIANCE) 10 MG TABS tablet Take 10 mg by mouth daily.     ferrous sulfate 325 (65 FE) MG tablet Take 325 mg by mouth daily with breakfast.     imiquimod (ALDARA) 5 % cream Apply topically 3 (three) times a week. Apply entire packet of cream to affected areas 3 times weekly.  Apply at night and leave on during sleeping hours.  Wash off completely after 8 hours. 12 each 2   indapamide (LOZOL) 2.5 MG tablet Take 2.5 mg by mouth daily.  naproxen sodium (ANAPROX) 220 MG tablet Take 440 mg by mouth daily as needed (menstrual cycle).     olmesartan (BENICAR) 40 MG tablet Take 40 mg by mouth daily. am     Probiotic Product (PROBIOTIC DAILY PO) Take 1 capsule by mouth daily. am     vitamin B-12 (CYANOCOBALAMIN) 100 MCG tablet Take 100 mcg by mouth daily.     No current facility-administered medications for this visit.     ALLERGIES: Lisinopril and Betadine [povidone iodine]  Family History  Problem Relation Age of Onset   Peripheral vascular disease Mother    Diabetes Father    Cirrhosis Father    Multiple sclerosis Sister    Heart disease Maternal Aunt        CABG   Diabetes Maternal Uncle    Hypertension Maternal Uncle    Diabetes Paternal Aunt    Hypertension Paternal Aunt    Breast cancer Paternal Aunt 23   Diabetes Maternal Grandmother    Heart disease  Maternal Grandmother        CVA   Breast cancer Daughter 40   Other Daughter     Social History   Socioeconomic History   Marital status: Divorced    Spouse name: Not on file   Number of children: Not on file   Years of education: Not on file   Highest education level: Not on file  Occupational History   Not on file  Tobacco Use   Smoking status: Former    Packs/day: .25    Types: Cigarettes   Smokeless tobacco: Never  Vaping Use   Vaping Use: Never used  Substance and Sexual Activity   Alcohol use: Yes    Alcohol/week: 14.0 standard drinks of alcohol    Types: 14 Cans of beer per week   Drug use: No   Sexual activity: Not Currently    Partners: Male    Birth control/protection: Surgical    Comment: BTL  Other Topics Concern   Not on file  Social History Narrative   Works for [employer] from home    Social Determinants of Health   Financial Resource Strain: Not on file  Food Insecurity: Not on file  Transportation Needs: Not on file  Physical Activity: Not on file  Stress: Not on file  Social Connections: Not on file  Intimate Partner Violence: Not on file    ROS  PHYSICAL EXAMINATION:    BP 110/72   Wt 157 lb (71.2 kg)   BMI 26.95 kg/m     General appearance: alert, cooperative and appears stated age   Pelvic: External genitalia:  several raised skin colored lesions on the posterior fourchette, ? Condyloma vs skin tags              Urethra:  normal appearing urethra with no masses, tenderness or lesions              Bartholins and Skenes: normal                 Vagina: normal appearing vagina with a slight increase in thin and thick white vaginal d/c              Cervix: no lesions and IUD string 2-3 cm              Chaperone was present for exam.  1. Acute vaginitis - WET PREP FOR TRICH, YEAST, CLUE  2. Screening examination for STD (sexually transmitted disease) Declines blood work -  SURESWAB CT/NG/T. vaginalis  3. Yeast  vaginitis - fluconazole (DIFLUCAN) 150 MG tablet; Take 1 tablet (150 mg total) by mouth once for 1 dose. Take one tablet.  Repeat in 72 hours if symptoms are not completely resolved.  Dispense: 2 tablet; Refill: 0  4. BV (bacterial vaginosis) - metroNIDAZOLE (METROGEL) 0.75 % vaginal gel; Place 1 Applicatorful vaginally at bedtime. Use for 5 nights  Dispense: 70 g; Refill: 0  5. Vulvar lesion Not sure if condyloma, discussed the option of a biopsy. She doesn't feel it has changed.

## 2023-03-19 NOTE — Patient Instructions (Signed)
Bacterial Vaginosis  Bacterial vaginosis is an infection that occurs when the normal balance of bacteria in the vagina changes. This change is caused by an overgrowth of certain bacteria in the vagina. Bacterial vaginosis is the most common vaginal infection among females aged 55 to 44 years. This condition increases the risk of sexually transmitted infections (STIs). Treatment can help reduce this risk. Treatment is very important for pregnant women because this condition can cause babies to be born early (prematurely) or at a low birth weight. What are the causes? This condition is caused by an increase in harmful bacteria that are normally present in small amounts in the vagina. However, the exact reason this condition develops is not known. You cannot get bacterial vaginosis from toilet seats, bedding, swimming pools, or contact with objects around you. What increases the risk? The following factors may make you more likely to develop this condition: Having a new sexual partner or multiple sexual partners, or having unprotected sex. Douching. Having an intrauterine device (IUD). Smoking. Abusing drugs and alcohol. This may lead to riskier sexual behavior. Taking certain antibiotic medicines. Being pregnant. What are the signs or symptoms? Some women with this condition have no symptoms. Symptoms may include: Gray or white vaginal discharge. The discharge can be watery or foamy. A fish-like odor with discharge, especially after sex or during menstruation. Itching in and around the vagina. Burning or pain with urination. How is this diagnosed? This condition is diagnosed based on: Your medical history. A physical exam of the vagina. Checking a sample of vaginal fluid for harmful bacteria or abnormal cells. How is this treated? This condition is treated with antibiotic medicines. These may be given as a pill, a vaginal cream, or a medicine that is put into the vagina (suppository). If  the condition comes back after treatment, a second round of antibiotics may be needed. Follow these instructions at home: Medicines Take or apply over-the-counter and prescription medicines only as told by your health care provider. Take or apply your antibiotic medicine as told by your health care provider. Do not stop using the antibiotic even if you start to feel better. General instructions If you have a female sexual partner, tell her that you have a vaginal infection. She should follow up with her health care provider. If you have a female sexual partner, he does not need treatment. Avoid sexual activity until you finish treatment. Drink enough fluid to keep your urine pale yellow. Keep the area around your vagina and rectum clean. Wash the area daily with warm water. Wipe yourself from front to back after using the toilet. If you are breastfeeding, talk to your health care provider about continuing breastfeeding during treatment. Keep all follow-up visits. This is important. How is this prevented? Self-care Do not douche. Wash the outside of your vagina with warm water only. Wear cotton or cotton-lined underwear. Avoid wearing tight pants and pantyhose, especially during the summer. Safe sex Use protection when having sex. This includes: Using condoms. Using dental dams. This is a thin layer of a material made of latex or polyurethane that protects the mouth during oral sex. Limit the number of sexual partners. To help prevent bacterial vaginosis, it is best to have sex with just one partner (monogamous relationship). Make sure you and your sexual partner are tested for STIs. Drugs and alcohol Do not use any products that contain nicotine or tobacco. These products include cigarettes, chewing tobacco, and vaping devices, such as e-cigarettes. If you need help   quitting, ask your health care provider. Do not use drugs. Do not drink alcohol if: Your health care provider tells you not  to do this. You are pregnant, may be pregnant, or are planning to become pregnant. If you drink alcohol: Limit how much you have to 0-1 drink a day. Be aware of how much alcohol is in your drink. In the [place] one drink equals one 12 oz bottle of beer (355 mL), one 5 oz glass of wine (148 mL), or one 1 oz glass of hard liquor (44 mL). Where to find more information Centers for Disease Control and Prevention: www.cdc.gov American Sexual Health Association (ASHA): www.ashastd.org [place]. Department of Health and Human Services, Office on Women's Health: www.womenshealth.gov Contact a health care provider if: Your symptoms do not improve, even after treatment. You have more discharge or pain when urinating. You have a fever or chills. You have pain in your abdomen or pelvis. You have pain during sex. You have vaginal bleeding between menstrual periods. Summary Bacterial vaginosis is a vaginal infection that occurs when the normal balance of bacteria in the vagina changes. It results from an overgrowth of certain bacteria. This condition increases the risk of sexually transmitted infections (STIs). Getting treated can help reduce this risk. Treatment is very important for pregnant women because this condition can cause babies to be born early (prematurely) or at low birth weight. This condition is treated with antibiotic medicines. These may be given as a pill, a vaginal cream, or a medicine that is put into the vagina (suppository). This information is not intended to replace advice given to you by your health care provider. Make sure you discuss any questions you have with your health care provider. Document Revised: 03/10/2020 Document Reviewed: 03/10/2020 Elsevier Patient Education  2024 Elsevier Inc. Vaginal Yeast Infection, Adult  Vaginal yeast infection is a condition that causes vaginal discharge as well as soreness, swelling, and redness (inflammation) of the vagina. This is a common  condition. Some women get this infection frequently. What are the causes? This condition is caused by a change in the normal balance of the yeast (Candida) and normal bacteria that live in the vagina. This change causes an overgrowth of yeast, which causes the inflammation. What increases the risk? The condition is more likely to develop in women who: Take antibiotic medicines. Have diabetes. Take birth control pills. Are pregnant. Douche often. Have a weak body defense system (immune system). Have been taking steroid medicines for a long time. Frequently wear tight clothing. What are the signs or symptoms? Symptoms of this condition include: White, thick, creamy vaginal discharge. Swelling, itching, redness, and irritation of the vagina. The lips of the vagina (labia) may be affected as well. Pain or a burning feeling while urinating. Pain during sex. How is this diagnosed? This condition is diagnosed based on: Your medical history. A physical exam. A pelvic exam. Your health care provider will examine a sample of your vaginal discharge under a microscope. Your health care provider may send this sample for testing to confirm the diagnosis. How is this treated? This condition is treated with medicine. Medicines may be over-the-counter or prescription. You may be told to use one or more of the following: Medicine that is taken by mouth (orally). Medicine that is applied as a cream (topically). Medicine that is inserted directly into the vagina (suppository). Follow these instructions at home: Take or apply over-the-counter and prescription medicines only as told by your health care provider. Do not use   tampons until your health care provider approves. Do not have sex until your infection has cleared. Sex can prolong or worsen your symptoms of infection. Ask your health care provider when it is safe to resume sexual activity. Keep all follow-up visits. This is important. How is this  prevented?  Do not wear tight clothes, such as pantyhose or tight pants. Wear breathable cotton underwear. Do not use douches, perfumed soap, creams, or powders. Wipe from front to back after using the toilet. If you have diabetes, keep your blood sugar levels under control. Ask your health care provider for other ways to prevent yeast infections. Contact a health care provider if: You have a fever. Your symptoms go away and then return. Your symptoms do not get better with treatment. Your symptoms get worse. You have new symptoms. You develop blisters in or around your vagina. You have blood coming from your vagina and it is not your menstrual period. You develop pain in your abdomen. Summary Vaginal yeast infection is a condition that causes discharge as well as soreness, swelling, and redness (inflammation) of the vagina. This condition is treated with medicine. Medicines may be over-the-counter or prescription. Take or apply over-the-counter and prescription medicines only as told by your health care provider. Do not douche. Resume sexual activity or use of tampons as instructed by your health care provider. Contact a health care provider if your symptoms do not get better with treatment or your symptoms go away and then return. This information is not intended to replace advice given to you by your health care provider. Make sure you discuss any questions you have with your health care provider. Document Revised: 11/28/2020 Document Reviewed: 11/28/2020 Elsevier Patient Education  2024 Elsevier Inc.  

## 2023-03-20 LAB — SURESWAB CT/NG/T. VAGINALIS
C. trachomatis RNA, TMA: NOT DETECTED
N. gonorrhoeae RNA, TMA: NOT DETECTED
Trichomonas vaginalis RNA: NOT DETECTED

## 2023-05-21 ENCOUNTER — Ambulatory Visit: Payer: No Typology Code available for payment source | Admitting: Radiology

## 2023-08-01 ENCOUNTER — Ambulatory Visit: Payer: No Typology Code available for payment source | Admitting: Radiology

## 2023-08-05 ENCOUNTER — Encounter: Payer: Self-pay | Admitting: Radiology

## 2023-08-05 ENCOUNTER — Ambulatory Visit (INDEPENDENT_AMBULATORY_CARE_PROVIDER_SITE_OTHER): Payer: No Typology Code available for payment source | Admitting: Radiology

## 2023-08-05 VITALS — BP 102/60 | Ht 64.5 in | Wt 161.0 lb

## 2023-08-05 DIAGNOSIS — N898 Other specified noninflammatory disorders of vagina: Secondary | ICD-10-CM

## 2023-08-05 DIAGNOSIS — Z01419 Encounter for gynecological examination (general) (routine) without abnormal findings: Secondary | ICD-10-CM

## 2023-08-05 DIAGNOSIS — Z975 Presence of (intrauterine) contraceptive device: Secondary | ICD-10-CM | POA: Diagnosis not present

## 2023-08-05 LAB — WET PREP FOR TRICH, YEAST, CLUE

## 2023-08-05 NOTE — Patient Instructions (Signed)
Preventive Care 40-55 Years Old, Female Preventive care refers to lifestyle choices and visits with your health care provider that can promote health and wellness. Preventive care visits are also called wellness exams. What can I expect for my preventive care visit? Counseling Your health care provider may ask you questions about your: Medical history, including: Past medical problems. Family medical history. Pregnancy history. Current health, including: Menstrual cycle. Method of birth control. Emotional well-being. Home life and relationship well-being. Sexual activity and sexual health. Lifestyle, including: Alcohol, nicotine or tobacco, and drug use. Access to firearms. Diet, exercise, and sleep habits. Work and work environment. Sunscreen use. Safety issues such as seatbelt and bike helmet use. Physical exam Your health care provider will check your: Height and weight. These may be used to calculate your BMI (body mass index). BMI is a measurement that tells if you are at a healthy weight. Waist circumference. This measures the distance around your waistline. This measurement also tells if you are at a healthy weight and may help predict your risk of certain diseases, such as type 2 diabetes and high blood pressure. Heart rate and blood pressure. Body temperature. Skin for abnormal spots. What immunizations do I need?  Vaccines are usually given at various ages, according to a schedule. Your health care provider will recommend vaccines for you based on your age, medical history, and lifestyle or other factors, such as travel or where you work. What tests do I need? Screening Your health care provider may recommend screening tests for certain conditions. This may include: Lipid and cholesterol levels. Diabetes screening. This is done by checking your blood sugar (glucose) after you have not eaten for a while (fasting). Pelvic exam and Pap test. Hepatitis B test. Hepatitis C  test. HIV (human immunodeficiency virus) test. STI (sexually transmitted infection) testing, if you are at risk. Lung cancer screening. Colorectal cancer screening. Mammogram. Talk with your health care provider about when you should start having regular mammograms. This may depend on whether you have a family history of breast cancer. BRCA-related cancer screening. This may be done if you have a family history of breast, ovarian, tubal, or peritoneal cancers. Bone density scan. This is done to screen for osteoporosis. Talk with your health care provider about your test results, treatment options, and if necessary, the need for more tests. Follow these instructions at home: Eating and drinking  Eat a diet that includes fresh fruits and vegetables, whole grains, lean protein, and low-fat dairy products. Take vitamin and mineral supplements as recommended by your health care provider. Do not drink alcohol if: Your health care provider tells you not to drink. You are pregnant, may be pregnant, or are planning to become pregnant. If you drink alcohol: Limit how much you have to 0-1 drink a day. Know how much alcohol is in your drink. In the [place] one drink equals one 12 oz bottle of beer (355 mL), one 5 oz glass of wine (148 mL), or one 1 oz glass of hard liquor (44 mL). Lifestyle Brush your teeth every morning and night with fluoride toothpaste. Floss one time each day. Exercise for at least 30 minutes 5 or more days each week. Do not use any products that contain nicotine or tobacco. These products include cigarettes, chewing tobacco, and vaping devices, such as e-cigarettes. If you need help quitting, ask your health care provider. Do not use drugs. If you are sexually active, practice safe sex. Use a condom or other form of protection to   prevent STIs. If you do not wish to become pregnant, use a form of birth control. If you plan to become pregnant, see your health care provider for a  prepregnancy visit. Take aspirin only as told by your health care provider. Make sure that you understand how much to take and what form to take. Work with your health care provider to find out whether it is safe and beneficial for you to take aspirin daily. Find healthy ways to manage stress, such as: Meditation, yoga, or listening to music. Journaling. Talking to a trusted person. Spending time with friends and family. Minimize exposure to UV radiation to reduce your risk of skin cancer. Safety Always wear your seat belt while driving or riding in a vehicle. Do not drive: If you have been drinking alcohol. Do not ride with someone who has been drinking. When you are tired or distracted. While texting. If you have been using any mind-altering substances or drugs. Wear a helmet and other protective equipment during sports activities. If you have firearms in your house, make sure you follow all gun safety procedures. Seek help if you have been physically or sexually abused. What's next? Visit your health care provider once a year for an annual wellness visit. Ask your health care provider how often you should have your eyes and teeth checked. Stay up to date on all vaccines. This information is not intended to replace advice given to you by your health care provider. Make sure you discuss any questions you have with your health care provider. Document Revised: 03/08/2021 Document Reviewed: 03/08/2021 Elsevier Patient Education  2024 Elsevier Inc.  

## 2023-08-05 NOTE — Progress Notes (Signed)
Kayla Rojas [facility] 1966-04-21 [id number]   History:  57 y.o. G4P2 presents for annual exam. Mirena IUD placed in 2022 after myomectomy and D&C. Denies any irregular bleeding. C/o vaginal discharge. DM managed by PCP, currently taking ozempic and Jardiance.   Gynecologic History No LMP recorded. (Menstrual status: IUD).   Contraception/Family planning: IUD, BTL Sexually active: not currently Last Pap: 2022. Results were: normal, HPV neg Last mammogram: 9/23. Results were: normal Colonoscopy 2023  Obstetric History OB History  Gravida Para Term Preterm AB Living  4 2 2  0 2 2  SAB IAB Ectopic Multiple Live Births  0 2 0 0 2    # Outcome Date GA Lbr Len/2nd Weight Sex Type Anes PTL Lv  4 Term 1999    M CS-Classical   LIV  3 Term 22    F CS-Classical   LIV  2 IAB           1 IAB             The following portions of the patient's history were reviewed and updated as appropriate: allergies, current medications, past family history, past medical history, past social history, past surgical history, and problem list.  Review of Systems  Constitutional: Negative.   Cardiovascular: Negative.   Gastrointestinal:  Negative for abdominal pain, blood in stool, constipation, nausea and vomiting.  Genitourinary: Negative.  Negative for dysuria, flank pain, frequency, hematuria and urgency.       Vaginal discharge  Endo/Heme/Allergies:  Does not bruise/bleed easily.  Psychiatric/Behavioral: Negative.  Negative for depression, memory loss and substance abuse. The patient is not nervous/anxious and does not have insomnia.     Past medical history, past surgical history, family history and social history were all reviewed and documented in the EPIC chart.  Exam:  Vitals:   08/05/23 1332  BP: 102/60  Weight: 161 lb (73 kg)  Height: 5' 4.5" (1.638 m)   Body mass index is 27.21 kg/m.  Physical Exam Vitals and nursing note reviewed. Exam conducted with a chaperone present.   Constitutional:      Appearance: Normal appearance. She is normal weight.  HENT:     Head: Normocephalic and atraumatic.  Neck:     Thyroid: No thyroid mass, thyromegaly or thyroid tenderness.  Cardiovascular:     Rate and Rhythm: Regular rhythm.     Heart sounds: Normal heart sounds.  Pulmonary:     Effort: Pulmonary effort is normal.     Breath sounds: Normal breath sounds.  Chest:  Breasts:    Breasts are symmetrical.     Right: Normal. No inverted nipple, mass, nipple discharge, skin change or tenderness.     Left: Normal. No inverted nipple, mass, nipple discharge, skin change or tenderness.  Abdominal:     General: Abdomen is flat. Bowel sounds are normal.     Palpations: Abdomen is soft.  Genitourinary:    General: Normal vulva.     Vagina: Normal. No vaginal discharge, bleeding or lesions.     Cervix: Discharge present. No lesion.     Uterus: Normal. Not enlarged and not tender.      Adnexa: Right adnexa normal and left adnexa normal.       Right: No mass, tenderness or fullness.         Left: No mass, tenderness or fullness.    Lymphadenopathy:     Upper Body:     Right upper body: No axillary adenopathy.     Left upper  body: No axillary adenopathy.  Skin:    General: Skin is warm and dry.  Neurological:     Mental Status: She is alert and oriented to person, place, and time.  Psychiatric:        Mood and Affect: Mood normal.        Thought Content: Thought content normal.        Judgment: Judgment normal.     Raynelle Fanning, CMA present for exam  Assessment/Plan:   1. Well woman exam with routine gynecological exam Pap 2025  2. IUD (intrauterine device) in place May remain until 2030  3. Vaginal discharge Will contact with results of wet prep and manage accordingly - WET PREP FOR TRICH, YEAST, CLUE    Labs with PCP Schedule mammogram Increase exercise  Gerad Cornelio B WHNP-BC 1:48 PM 08/05/2023

## 2023-09-05 ENCOUNTER — Other Ambulatory Visit: Payer: Self-pay | Admitting: Family Medicine

## 2023-09-05 DIAGNOSIS — Z1231 Encounter for screening mammogram for malignant neoplasm of breast: Secondary | ICD-10-CM

## 2023-10-08 ENCOUNTER — Ambulatory Visit
Admission: RE | Admit: 2023-10-08 | Discharge: 2023-10-08 | Disposition: A | Discharge status: Home / Self Care | Payer: No Typology Code available for payment source | Source: Ambulatory Visit | Attending: Family Medicine | Admitting: Family Medicine

## 2023-10-08 DIAGNOSIS — Z1231 Encounter for screening mammogram for malignant neoplasm of breast: Secondary | ICD-10-CM

## 2024-03-02 ENCOUNTER — Ambulatory Visit: Admitting: Obstetrics and Gynecology

## 2024-03-02 ENCOUNTER — Ambulatory Visit: Payer: Self-pay | Admitting: Obstetrics and Gynecology

## 2024-03-02 ENCOUNTER — Encounter: Payer: Self-pay | Admitting: Obstetrics and Gynecology

## 2024-03-02 VITALS — BP 100/62 | HR 88 | Temp 98.6°F | Wt 164.0 lb

## 2024-03-02 DIAGNOSIS — Z113 Encounter for screening for infections with a predominantly sexual mode of transmission: Secondary | ICD-10-CM | POA: Diagnosis not present

## 2024-03-02 DIAGNOSIS — R309 Painful micturition, unspecified: Secondary | ICD-10-CM

## 2024-03-02 DIAGNOSIS — N958 Other specified menopausal and perimenopausal disorders: Secondary | ICD-10-CM | POA: Diagnosis not present

## 2024-03-02 DIAGNOSIS — N898 Other specified noninflammatory disorders of vagina: Secondary | ICD-10-CM

## 2024-03-02 DIAGNOSIS — B9689 Other specified bacterial agents as the cause of diseases classified elsewhere: Secondary | ICD-10-CM

## 2024-03-02 DIAGNOSIS — N760 Acute vaginitis: Secondary | ICD-10-CM | POA: Diagnosis not present

## 2024-03-02 DIAGNOSIS — N3001 Acute cystitis with hematuria: Secondary | ICD-10-CM

## 2024-03-02 LAB — WET PREP FOR TRICH, YEAST, CLUE

## 2024-03-02 MED ORDER — ESTRADIOL 0.1 MG/GM VA CREA: TOPICAL_CREAM | VAGINAL | 1 refills | Status: AC

## 2024-03-02 MED ORDER — METRONIDAZOLE 0.75 % VA GEL: 1.0000 | Freq: Every day | VAGINAL | 0 refills | Status: AC

## 2024-03-02 MED ORDER — FLUCONAZOLE 150 MG PO TABS: 150.0000 mg | ORAL_TABLET | Freq: Once | ORAL | 1 refills | Status: AC

## 2024-03-02 NOTE — Progress Notes (Signed)
 58 y.o. [id number] female with Mirena  IUD placed in 2022 after myomectomy and D&C here for GU complaints. Divorced.  No LMP recorded. (Menstrual status: IUD).   New sexual partner. Intercourse 2 wk ago. She reports pelvic pressure when urinating, vaginal burning and itching. Reports symptoms for about a week.  No discharge or odor. +vaginal dryness during intercourse. T2DM on jardiance and ozempic.  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   Substance abuse (HCC)    trichomonas 12/2018 treated   Type 2 diabetes mellitus (HCC)    Vulvar vestibulitis 2005   Past Surgical History:  Procedure Laterality Date   BUNIONECTOMY Bilateral    CESAREAN SECTION     X 2   CHOLECYSTECTOMY, LAPAROSCOPIC  11/2009   HYSTEROSCOPY N/A 06/26/2021   Procedure: Hysteroscopic myomectomy, Dilation & Curettage;  Surgeon: Wanita Gutta, MD;  Location: [facility] [place];  Service: Gynecology;  Laterality: N/A;   INDUCED ABORTION     INTRAUTERINE DEVICE (IUD) INSERTION N/A 06/26/2021   Procedure: Mirena  INTRAUTERINE DEVICE (IUD) INSERTION;  Surgeon: Jertson, Jill Evelyn, MD;  Location: [facility] [place];  Service: Gynecology;  Laterality: N/A;   LAPAROSCOPY N/A 06/16/2013   Procedure: LAPAROSCOPY DIAGNOSTIC;  Surgeon: Axel Bohr, MD;  Location: [facility];  Service: Gynecology;  Laterality: N/A;   TUBAL LIGATION  2000   Current Outpatient Medications on File Prior to Visit  Medication Sig Dispense Refill    atenolol (TENORMIN) 50 MG tablet Take 50 mg by mouth daily. am     indapamide (LOZOL) 2.5 MG tablet Take 2.5 mg by mouth daily.     naproxen  sodium (ANAPROX ) 220 MG tablet Take 440 mg by mouth daily as needed (menstrual cycle).     olmesartan (BENICAR) 40 MG tablet Take 40 mg by mouth daily. am     OZEMPIC, 2 MG/DOSE, 8 MG/3ML SOPN Inject into the skin.     Potassium Chloride ER 20 MEQ TBCR Take 1 tablet by mouth daily.     Probiotic Product (PROBIOTIC DAILY PO) Take 1 capsule by mouth daily. am     empagliflozin (JARDIANCE) 10 MG TABS tablet Take 10 mg by mouth daily. (Patient not taking: Reported on 03/02/2024)     ferrous sulfate 325 (65 FE) MG tablet Take 325 mg by mouth daily with breakfast. (Patient not taking: Reported on 03/02/2024)     vitamin B-12 (CYANOCOBALAMIN) 100 MCG tablet Take 100 mcg by mouth daily. (Patient not taking: Reported on 03/02/2024)     No current facility-administered medications on file prior to visit.   Allergies  Allergen Reactions   Lisinopril Cough   Betadine [Povidone Iodine] Itching and Rash    PE Today's  Vitals   03/02/24 1343  BP: 100/62  Pulse: 88  Temp: 98.6 F (37 C)  TempSrc: Oral  SpO2: 98%  Weight: 164 lb (74.4 kg)   Body mass index is 27.72 kg/m.  Physical Exam Vitals reviewed. Exam conducted with a chaperone present.  Constitutional:      General: She is not in acute distress.    Appearance: Normal appearance.  HENT:     Head: Normocephalic and atraumatic.     Nose: Nose normal.  Eyes:     Extraocular Movements: Extraocular movements intact.     Conjunctiva/sclera: Conjunctivae normal.  Pulmonary:     Effort: Pulmonary effort is normal.  Genitourinary:    General: Normal vulva.     Exam position: Lithotomy position.     Vagina: Normal. No vaginal discharge.     Cervix: Normal. No cervical motion tenderness, discharge or lesion.     Uterus: Normal. Not enlarged and not tender.      Adnexa: Right adnexa normal and left adnexa  normal.     Comments: IUD strings present. Musculoskeletal:        General: Normal range of motion.     Cervical back: Normal range of motion.  Neurological:     General: No focal deficit present.     Mental Status: She is alert.  Psychiatric:        Mood and Affect: Mood normal.        Behavior: Behavior normal.       Assessment and Plan:        Pain with urination -     Urinalysis,Complete w/RFL Culture  Vaginal irritation -     WET PREP FOR TRICH, YEAST, CLUE  Screen for STD (sexually transmitted disease) -     SURESWAB CT/NG/T. vaginalis  BV (bacterial vaginosis) -     metroNIDAZOLE ; Place 1 Applicatorful vaginally at bedtime for 5 days.  Dispense: 50 g; Refill: 0 -     Fluconazole ; Take 1 tablet (150 mg total) by mouth once for 1 dose. May repeat dose in 48 hours if symptoms are not resolved.  Dispense: 2 tablet; Refill: 1  Genitourinary syndrome of menopause -     Estradiol; Apply 1/2 gram to vulva nightly for 2 weeks then decrease to 1/2 gram to vulva two nights a week. Do not use applicator.  Dispense: 42.5 g; Refill: 1  Discussed use of condoms and lubricant as strategy to prevent BV recurrence however her partner may need to be treated if recurrence happens.  Romaine Closs, MD

## 2024-03-03 LAB — SURESWAB CT/NG/T. VAGINALIS
C. trachomatis RNA, TMA: NOT DETECTED
N. gonorrhoeae RNA, TMA: NOT DETECTED
Trichomonas vaginalis RNA: NOT DETECTED

## 2024-03-04 LAB — URINALYSIS, COMPLETE W/RFL CULTURE
Bilirubin Urine: NEGATIVE
Glucose, UA: NEGATIVE
Hyaline Cast: NONE SEEN /LPF
Ketones, ur: NEGATIVE
Nitrites, Initial: NEGATIVE
Specific Gravity, Urine: 1.02 (ref 1.001–1.035)
WBC, UA: >=60 /HPF — AB (ref 0–5)
pH: 6.5 (ref 5.0–8.0)

## 2024-03-04 LAB — URINE CULTURE
MICRO NUMBER:: 16555438
SPECIMEN QUALITY:: ADEQUATE

## 2024-03-04 LAB — CULTURE INDICATED

## 2024-03-04 MED ORDER — NITROFURANTOIN MONOHYD MACRO 100 MG PO CAPS: 100.0000 mg | ORAL_CAPSULE | Freq: Two times a day (BID) | ORAL | 0 refills | Status: DC

## 2024-03-05 ENCOUNTER — Other Ambulatory Visit: Payer: Self-pay

## 2024-03-05 MED ORDER — NITROFURANTOIN MONOHYD MACRO 100 MG PO CAPS: 100.0000 mg | ORAL_CAPSULE | Freq: Two times a day (BID) | ORAL | 0 refills | Status: AC

## 2024-03-05 NOTE — Telephone Encounter (Signed)
 Patient needs medication sent to the [facility] on [address] instead of [facility].

## 2024-08-11 ENCOUNTER — Ambulatory Visit: Payer: No Typology Code available for payment source | Admitting: Radiology

## 2024-09-04 ENCOUNTER — Other Ambulatory Visit: Payer: Self-pay | Admitting: Student

## 2024-09-04 ENCOUNTER — Other Ambulatory Visit: Payer: Self-pay | Admitting: Family Medicine

## 2024-09-04 DIAGNOSIS — Z1231 Encounter for screening mammogram for malignant neoplasm of breast: Secondary | ICD-10-CM

## 2024-09-08 ENCOUNTER — Other Ambulatory Visit (HOSPITAL_COMMUNITY): Payer: Self-pay | Admitting: Student

## 2024-09-08 DIAGNOSIS — R600 Localized edema: Secondary | ICD-10-CM

## 2024-09-14 ENCOUNTER — Ambulatory Visit: Admitting: Radiology

## 2024-10-13 ENCOUNTER — Ambulatory Visit
Admission: RE | Admit: 2024-10-13 | Discharge: 2024-10-13 | Disposition: A | Source: Ambulatory Visit | Attending: Family Medicine | Admitting: Family Medicine

## 2024-10-13 DIAGNOSIS — Z1231 Encounter for screening mammogram for malignant neoplasm of breast: Secondary | ICD-10-CM

## 2024-10-20 ENCOUNTER — Ambulatory Visit (HOSPITAL_COMMUNITY)

## 2024-10-20 ENCOUNTER — Encounter (HOSPITAL_COMMUNITY): Payer: Self-pay

## 2024-10-23 ENCOUNTER — Other Ambulatory Visit: Payer: Self-pay | Admitting: Student

## 2024-10-23 DIAGNOSIS — R928 Other abnormal and inconclusive findings on diagnostic imaging of breast: Secondary | ICD-10-CM

## 2024-10-27 ENCOUNTER — Other Ambulatory Visit: Payer: Self-pay | Admitting: Student

## 2024-10-27 DIAGNOSIS — R928 Other abnormal and inconclusive findings on diagnostic imaging of breast: Secondary | ICD-10-CM

## 2024-11-02 ENCOUNTER — Other Ambulatory Visit

## 2024-11-02 ENCOUNTER — Encounter

## 2024-11-10 ENCOUNTER — Ambulatory Visit: Admitting: Radiology
